# Patient Record
Sex: Female | Born: 1965
Health system: Southern US, Community
[De-identification: ages and names within clinical notes are randomized; demographics above are authoritative.]

## PROBLEM LIST (undated history)

## (undated) DIAGNOSIS — D219 Benign neoplasm of connective and other soft tissue, unspecified: Secondary | ICD-10-CM

## (undated) HISTORY — PX: MOHS SURGERY: SUR867

## (undated) HISTORY — DX: Benign neoplasm of connective and other soft tissue, unspecified: D21.9

---

## 1998-05-03 ENCOUNTER — Other Ambulatory Visit: Admission: RE | Admit: 1998-05-03 | Discharge: 1998-05-03 | Payer: Self-pay | Admitting: Obstetrics and Gynecology

## 1999-09-26 ENCOUNTER — Other Ambulatory Visit: Admission: RE | Admit: 1999-09-26 | Discharge: 1999-09-26 | Payer: Self-pay | Admitting: Obstetrics and Gynecology

## 2000-06-04 ENCOUNTER — Encounter: Admission: RE | Admit: 2000-06-04 | Discharge: 2000-06-04 | Payer: Self-pay | Admitting: Family Medicine

## 2000-06-04 ENCOUNTER — Encounter: Payer: Self-pay | Admitting: Family Medicine

## 2000-10-11 ENCOUNTER — Other Ambulatory Visit: Admission: RE | Admit: 2000-10-11 | Discharge: 2000-10-11 | Payer: Self-pay | Admitting: Obstetrics and Gynecology

## 2001-07-23 ENCOUNTER — Encounter: Payer: Self-pay | Admitting: Family Medicine

## 2001-07-23 ENCOUNTER — Encounter: Admission: RE | Admit: 2001-07-23 | Discharge: 2001-07-23 | Payer: Self-pay | Admitting: Family Medicine

## 2001-10-15 ENCOUNTER — Other Ambulatory Visit: Admission: RE | Admit: 2001-10-15 | Discharge: 2001-10-15 | Payer: Self-pay | Admitting: Obstetrics and Gynecology

## 2002-10-17 ENCOUNTER — Other Ambulatory Visit: Admission: RE | Admit: 2002-10-17 | Discharge: 2002-10-17 | Payer: Self-pay | Admitting: Obstetrics and Gynecology

## 2004-03-14 ENCOUNTER — Other Ambulatory Visit: Admission: RE | Admit: 2004-03-14 | Discharge: 2004-03-14 | Payer: Self-pay | Admitting: Obstetrics and Gynecology

## 2005-07-17 ENCOUNTER — Other Ambulatory Visit: Admission: RE | Admit: 2005-07-17 | Discharge: 2005-07-17 | Payer: Self-pay | Admitting: Obstetrics & Gynecology

## 2005-10-31 ENCOUNTER — Encounter: Admission: RE | Admit: 2005-10-31 | Discharge: 2005-10-31 | Payer: Self-pay | Admitting: Gastroenterology

## 2005-12-25 ENCOUNTER — Encounter: Admission: RE | Admit: 2005-12-25 | Discharge: 2005-12-25 | Payer: Self-pay | Admitting: Obstetrics & Gynecology

## 2006-12-18 ENCOUNTER — Other Ambulatory Visit: Admission: RE | Admit: 2006-12-18 | Discharge: 2006-12-18 | Payer: Self-pay | Admitting: Obstetrics & Gynecology

## 2006-12-20 ENCOUNTER — Encounter: Admission: RE | Admit: 2006-12-20 | Discharge: 2006-12-20 | Payer: Self-pay | Admitting: Obstetrics & Gynecology

## 2008-03-09 ENCOUNTER — Encounter: Admission: RE | Admit: 2008-03-09 | Discharge: 2008-03-09 | Payer: Self-pay | Admitting: Obstetrics & Gynecology

## 2008-03-09 ENCOUNTER — Other Ambulatory Visit: Admission: RE | Admit: 2008-03-09 | Discharge: 2008-03-09 | Payer: Self-pay | Admitting: Obstetrics and Gynecology

## 2009-07-05 ENCOUNTER — Encounter: Admission: RE | Admit: 2009-07-05 | Discharge: 2009-07-05 | Payer: Self-pay | Admitting: Obstetrics & Gynecology

## 2010-06-20 ENCOUNTER — Encounter: Payer: Self-pay | Admitting: Obstetrics & Gynecology

## 2011-03-01 ENCOUNTER — Other Ambulatory Visit: Payer: Self-pay | Admitting: Obstetrics & Gynecology

## 2011-03-01 DIAGNOSIS — Z1231 Encounter for screening mammogram for malignant neoplasm of breast: Secondary | ICD-10-CM

## 2011-03-20 ENCOUNTER — Ambulatory Visit
Admission: RE | Admit: 2011-03-20 | Discharge: 2011-03-20 | Disposition: A | Discharge status: Home / Self Care | Payer: 59 | Source: Ambulatory Visit | Attending: Obstetrics & Gynecology | Admitting: Obstetrics & Gynecology

## 2011-03-20 DIAGNOSIS — Z1231 Encounter for screening mammogram for malignant neoplasm of breast: Secondary | ICD-10-CM

## 2012-01-09 ENCOUNTER — Other Ambulatory Visit: Payer: Self-pay | Admitting: Obstetrics & Gynecology

## 2012-01-09 DIAGNOSIS — Z1231 Encounter for screening mammogram for malignant neoplasm of breast: Secondary | ICD-10-CM

## 2012-01-09 DIAGNOSIS — N6321 Unspecified lump in the left breast, upper outer quadrant: Secondary | ICD-10-CM

## 2012-04-11 ENCOUNTER — Ambulatory Visit: Payer: 59

## 2012-04-11 ENCOUNTER — Other Ambulatory Visit: Payer: Self-pay | Admitting: Obstetrics & Gynecology

## 2012-04-11 DIAGNOSIS — N6321 Unspecified lump in the left breast, upper outer quadrant: Secondary | ICD-10-CM

## 2012-04-11 LAB — HM PAP SMEAR: HM Pap smear: NEGATIVE

## 2012-04-18 ENCOUNTER — Ambulatory Visit
Admission: RE | Admit: 2012-04-18 | Discharge: 2012-04-18 | Disposition: A | Discharge status: Home / Self Care | Payer: 59 | Source: Ambulatory Visit | Attending: Obstetrics & Gynecology | Admitting: Obstetrics & Gynecology

## 2012-04-18 DIAGNOSIS — N6321 Unspecified lump in the left breast, upper outer quadrant: Secondary | ICD-10-CM

## 2012-04-18 LAB — HM MAMMOGRAPHY

## 2012-09-23 ENCOUNTER — Telehealth: Payer: Self-pay | Admitting: Certified Nurse Midwife

## 2012-09-23 NOTE — Telephone Encounter (Signed)
Pt is bleeding between cycles. Please call to schedule an appt.

## 2012-09-23 NOTE — Telephone Encounter (Signed)
Patient stated is having bleeding for the first time @ her last cycle a week ago. Concern this is new for her. Request appt. With Ortencia Kick, CNM. Appt. Given wed. @ 9:15am.

## 2012-09-24 ENCOUNTER — Encounter: Payer: Self-pay | Admitting: Certified Nurse Midwife

## 2012-09-25 ENCOUNTER — Encounter: Payer: Self-pay | Admitting: Certified Nurse Midwife

## 2012-09-25 ENCOUNTER — Ambulatory Visit (INDEPENDENT_AMBULATORY_CARE_PROVIDER_SITE_OTHER): Payer: 59 | Admitting: Certified Nurse Midwife

## 2012-09-25 VITALS — BP 100/60 | HR 72 | Resp 16

## 2012-09-25 DIAGNOSIS — N6012 Diffuse cystic mastopathy of left breast: Secondary | ICD-10-CM

## 2012-09-25 DIAGNOSIS — N6019 Diffuse cystic mastopathy of unspecified breast: Secondary | ICD-10-CM

## 2012-09-25 DIAGNOSIS — N949 Unspecified condition associated with female genital organs and menstrual cycle: Secondary | ICD-10-CM

## 2012-09-25 DIAGNOSIS — N938 Other specified abnormal uterine and vaginal bleeding: Secondary | ICD-10-CM

## 2012-09-25 DIAGNOSIS — R350 Frequency of micturition: Secondary | ICD-10-CM

## 2012-09-25 LAB — POCT URINALYSIS DIPSTICK
Bilirubin, UA: NEGATIVE
Spec Grav, UA: 1.02
Urobilinogen, UA: NEGATIVE

## 2012-09-25 LAB — TSH: TSH: 1.696 u[IU]/mL (ref 0.350–4.500)

## 2012-09-25 NOTE — Progress Notes (Signed)
47 yo mw female g1p1 here complaining of two periods in one month. LMP occurred  4-14 to 4-18, normal, no issues, then had bleeding again 4-25 with 3 day duration, light pad only one daily, light cramping no medication needed. Contraception spouse vasectomy. Patient has never had any changes in her cycle "ever".  Periods usually monthly 3-4 days, occasional cramping, moderate to light flow. Patient denies any additional medications, only new change is regular exercise with weights and dietary change for healthier eating with 5 lb weight loss.  Patient states" never had period change" just wanted to make sure no issues. Also due for follow up left breast check after mammogram noted small cyst cluster in left breast on routine screening, with benign findings. Patient does monthly SBE no change noted, she can feel cyst area now after being shown at mammogram.  Denies nipple discharge, skin change or pain. Patient also seen at [facility] for ? Blood in urine, but she later realized it was coming from her vagina, but had noted some urinary frequency.  Urine there showed leukocytes moderate, please check.  Denies urinary urgency, pain or blood in urine. No fever or chills, or back pain.  O: Healthy WD WN female Affect: normal, orientation X 3 Urine: negative Contraception: spouse vasectomy  Exam: Skin: warm and dry Thyroid: normal, nodules noted CVAT: negative Abdomen: soft non tender, negative suprapubic tenderness Breasts: Left breast @ 1'oclock 3-4 fb from nipple movile cystic mass pea size, non tender, no nipple discharge or skin change( this agrees with mammogram finding) and patient palpation, Right breast no masses, skin change or nipple discharge.  Pelvic Exam: External Genital area normal no lesions BUS: normal, non tender Bladder: non tender  Vagina: no blood present, normal appearance Cervix: normal, non tender, no present Uterus: nodular anterior,non tender, anteflexed( known  fibroid) Adnexa: normal no masses, non tender Perineal area: no lesions  A: DUB X1 only with normal pelvic exam Left Breast cyst, no change from mammogram results Urinary frequency x 1 with negative urine  P: Discussed normal pelvic exam and bleeding can occur at ovulation at times, also thyroid can affect cycles, along with weight gain or loss, also perimenopausal changes and with history of fibroid.. Questions addressed.  Given menses calendar with normal and abnormal parameters to keep, notify if continues to occur over the next two months.  Patient agreeable. Discussed finding of no change and keep mammogram follow up. Patient agreeable.  Continue SBE, notify if change Reassured all negative, notify if change, increase water po daily. Lab: TSH  Rv prn as above Reviewed, TL

## 2013-04-08 ENCOUNTER — Other Ambulatory Visit: Payer: Self-pay

## 2013-04-08 DIAGNOSIS — Z1231 Encounter for screening mammogram for malignant neoplasm of breast: Secondary | ICD-10-CM

## 2013-05-02 ENCOUNTER — Encounter: Payer: Self-pay | Admitting: Certified Nurse Midwife

## 2013-05-02 ENCOUNTER — Ambulatory Visit (INDEPENDENT_AMBULATORY_CARE_PROVIDER_SITE_OTHER): Payer: 59 | Admitting: Certified Nurse Midwife

## 2013-05-02 VITALS — BP 102/60 | HR 60 | Resp 16 | Ht <= 58 in | Wt 110.0 lb

## 2013-05-02 DIAGNOSIS — Z01419 Encounter for gynecological examination (general) (routine) without abnormal findings: Secondary | ICD-10-CM

## 2013-05-02 DIAGNOSIS — Z0000 Encounter for general adult medical examination without abnormal findings: Secondary | ICD-10-CM

## 2013-05-02 LAB — HEMOGLOBIN A1C: Mean Plasma Glucose: 80 mg/dL (ref <117)

## 2013-05-02 LAB — COMPREHENSIVE METABOLIC PANEL
ALT: 12 U/L (ref 0–35)
AST: 15 U/L (ref 0–37)
Albumin: 4.7 g/dL (ref 3.5–5.2)
BUN: 13 mg/dL (ref 6–23)
CO2: 29 mEq/L (ref 19–32)
Creat: 0.6 mg/dL (ref 0.50–1.10)
Glucose, Bld: 76 mg/dL (ref 70–99)
Potassium: 4 mEq/L (ref 3.5–5.3)
Sodium: 138 mEq/L (ref 135–145)
Total Protein: 7.3 g/dL (ref 6.0–8.3)

## 2013-05-02 NOTE — Patient Instructions (Signed)

## 2013-05-02 NOTE — Progress Notes (Signed)
47 y.o. G63P1001 Married Caucasian Fe here for annual exam.  Periods normal but longer duration 5-7 days. No issues with now. Feeling well. Sees PCP prn. No health issues today.  Patient's last menstrual period was 04/28/2013.          Sexually active: yes  The current method of family planning is vasectomy.    Exercising: yes  walk & light weights Smoker:  no  Health Maintenance: Pap:  04-11-12 neg HPV HR neg MMG:  11/13 scheduled for monday Colonoscopy:  none BMD:   none TDaP:  2013 Labs: Hgb-14.6 Self breast exam: done occ   reports that she has never smoked. She does not have any smokeless tobacco history on file. She reports that she does not drink alcohol or use illicit drugs.  Past Medical History  Diagnosis Date  . Fibroid     History reviewed. No pertinent past surgical history.  Current Outpatient Prescriptions  Medication Sig Dispense Refill  . Calcium Carbonate-Vitamin D (CALCIUM + D PO) Take by mouth as needed.       . Cholecalciferol (VITAMIN D PO) Take 2,000 Int'l Units by mouth daily.       . clobetasol cream (TEMOVATE) 0.05 % as needed.       . doxycycline (VIBRAMYCIN) 50 MG capsule 2 (two) times daily.       No current facility-administered medications for this visit.    Family History  Problem Relation Age of Onset  . Osteoporosis Mother   . Cancer Father     melanoma  . Hypertension Father   . Heart attack Father   . Cancer Sister     melanoma  . Cancer Sister     melanoma    ROS:  Pertinent items are noted in HPI.  Otherwise, a comprehensive ROS was negative.  Exam:   BP 102/60  Pulse 60  Resp 16  Ht 4\' 8"  (1.422 m)  Wt 110 lb (49.896 kg)  BMI 24.68 kg/m2  LMP 04/28/2013 Height: 4\' 8"  (142.2 cm)  Ht Readings from Last 3 Encounters:  05/02/13 4\' 8"  (1.422 m)    General appearance: alert, cooperative and appears stated age Head: Normocephalic, without obvious abnormality, atraumatic Neck: no adenopathy, supple, symmetrical, trachea  midline and thyroid normal to inspection and palpation and non-palpable Lungs: clear to auscultation bilaterally Breasts: normal appearance, no masses or tenderness, No nipple retraction or dimpling, No nipple discharge or bleeding, No axillary or supraclavicular adenopathy Heart: regular rate and rhythm Abdomen: soft, non-tender; no masses,  no organomegaly Extremities: extremities normal, atraumatic, no cyanosis or edema Skin: Skin color, texture, turgor normal. No rashes or lesions Lymph nodes: Cervical, supraclavicular, and axillary nodes normal. No abnormal inguinal nodes palpated Neurologic: Grossly normal   Pelvic: External genitalia:  no lesions              Urethra:  normal appearing urethra with no masses, tenderness or lesions              Bartholin's and Skene's: normal                 Vagina: normal appearing vagina with normal color and discharge, no lesions              Cervix: normal, non  tender              Pap taken: yes Bimanual Exam:  Uterus:  normal size, contour, position, consistency, mobility, non-tender and anteverted  Adnexa: normal adnexa and no mass, fullness, tenderness               Rectovaginal: Confirms               Anus:  normal sphincter tone, no lesions  A:  Well Woman with normal exam  Labs requested    P:   Reviewed health and wellness pertinent to exam  Labs:CMP,Hgb A1-c  Pap smear as per guidelines   Mammogram yearly pap smear taken today with reflex  counseled on breast self exam, mammography screening, adequate intake of calcium and vitamin D, diet and exercise  return annually or prn  An After Visit Summary was printed and given to the patient.

## 2013-05-03 LAB — VITAMIN D 25 HYDROXY (VIT D DEFICIENCY, FRACTURES): Vit D, 25-Hydroxy: 32 ng/mL (ref 30–89)

## 2013-05-05 ENCOUNTER — Ambulatory Visit: Payer: 59

## 2013-05-06 NOTE — Progress Notes (Signed)
Note reviewed, agree with plan.  Shandale Malak, MD  

## 2013-05-07 ENCOUNTER — Ambulatory Visit: Payer: 59

## 2013-05-26 ENCOUNTER — Ambulatory Visit
Admission: RE | Admit: 2013-05-26 | Discharge: 2013-05-26 | Disposition: A | Discharge status: Home / Self Care | Payer: 59 | Source: Ambulatory Visit

## 2013-05-26 DIAGNOSIS — Z1231 Encounter for screening mammogram for malignant neoplasm of breast: Secondary | ICD-10-CM

## 2013-06-11 ENCOUNTER — Telehealth: Payer: Self-pay | Admitting: Certified Nurse Midwife

## 2013-06-11 NOTE — Telephone Encounter (Signed)
Patient was calling to give debbi results from her mammogram "normal but extremely dense breast tissue which puts her at higher risk for breast cancer. Is there anything she needs to be doing to keep it in check besides a mammogram yearly? They told her something about people getting ultrasounds who have dense breast tissue."

## 2013-06-11 NOTE — Telephone Encounter (Signed)
Return call to patient, LMTCB.  Call to cell, Patient has just received letter regarding very dense breasts. She did research on her own and is aware this is a new law that patients are notified.  She did have a 3D mmg since was recalled the previous year for an ultrasound.  She read that up to 50% of cancers are missed in people with very dense breast and she wants to know if she should have any additional imaging? Please advise.

## 2013-06-12 NOTE — Telephone Encounter (Signed)
Patient notified of [name]'s response and that Dr Sabra Heck agrees with these recommendations as well. Advised to contact office for appt if she notices any change on monthly SBE. Patient agreeable. Encounter closed.

## 2013-06-12 NOTE — Telephone Encounter (Signed)
The current recommendation is for dense breast to have 3D imaging, routine monthly SBE and clinical exam yearly, unless family history positive for breast cancer( mother,sister,etc). I recently discussed this with Dr. Sabra Heck and these were her recommendations also.

## 2013-06-23 ENCOUNTER — Encounter: Payer: Self-pay | Admitting: Certified Nurse Midwife

## 2013-06-23 ENCOUNTER — Ambulatory Visit (INDEPENDENT_AMBULATORY_CARE_PROVIDER_SITE_OTHER): Payer: BC Managed Care – PPO | Admitting: Certified Nurse Midwife

## 2013-06-23 VITALS — BP 120/80 | HR 84 | Temp 98.2°F | Resp 16 | Ht <= 58 in | Wt 112.0 lb

## 2013-06-23 DIAGNOSIS — N390 Urinary tract infection, site not specified: Secondary | ICD-10-CM

## 2013-06-23 DIAGNOSIS — B373 Candidiasis of vulva and vagina: Secondary | ICD-10-CM

## 2013-06-23 DIAGNOSIS — B3731 Acute candidiasis of vulva and vagina: Secondary | ICD-10-CM

## 2013-06-23 LAB — POCT URINALYSIS DIPSTICK
Bilirubin, UA: NEGATIVE
Glucose, UA: NEGATIVE
Ketones, UA: NEGATIVE
LEUKOCYTES UA: NEGATIVE
NITRITE UA: NEGATIVE
Protein, UA: NEGATIVE
Urobilinogen, UA: NEGATIVE

## 2013-06-23 MED ORDER — FLUCONAZOLE 150 MG PO TABS: ORAL_TABLET | ORAL | Status: DC

## 2013-06-23 MED ORDER — PHENAZOPYRIDINE HCL 200 MG PO TABS: 200.0000 mg | ORAL_TABLET | Freq: Three times a day (TID) | ORAL | Status: DC | PRN

## 2013-06-23 NOTE — Progress Notes (Signed)
S:  48 y.o.Married Caucasian female presents with UTI 06/11/13  symptoms of urinary leakage spontaneous, no association with cough, sneeze or bearing down.  The patient is having no constitutional symptoms, denying fever, chills, anorexia, back pain or headache. Current method of birth control, spouse vasectomy. Patient denies urinary frequency, urgency or pain with urination. Patient did notice slight vaginal itching, but no discharge or vaginal dryness. Patient CMA with MD and had urine culture in office,showed 50 to 100,000 mixed flora. Patient was treated with 5 days of Bactrim with no change. Patient does not need pad for leakage, but can feel it when it occurs most of the time . No new personal products or thong use or pain with sexual activity. No other health issues today.  ROS: feels well  O alert, oriented to person, place, and time   healthy, well developed and well nourished  Skin: warm and dry  Abdomen: very slight suprapubic tenderness  CVAT: negative bilateral  Pelvic exam: External genital area: vulva increase pink color no lesions or scaling or exudate  Urethra, Urethral meatus non tender, no leaking with coughing or bearing down  Bladder: slight tenderness  Vagina: white thick non odorous discharge noted,ph 4.0, wet prep taken  Cervix: non tender, normal  Uterus: firm, irregular feel anterior, c/w history of fibroid, non tender  Adnexa: no masses, non tender, normal    Diagnostic Test:    Poct urine rbc- trace  Wet Prep: positive for yeast only  Assessment: Yeast vaginitis Mixed incontinence ? Due to yeast or UTI     Plan:Discssed findings with patient and association with urinary symptoms. Rx Diflucan see order Discussed will send repeat culture to see if change. Instructed to increase water intake and decrease any caffeine intake. Rx Pyridium see order Lab: Urine culture Patient to start DIflucan and call if symptoms decrease.  RV prn

## 2013-06-24 LAB — URINE CULTURE
COLONY COUNT: NO GROWTH
Organism ID, Bacteria: NO GROWTH

## 2013-06-25 ENCOUNTER — Encounter: Payer: Self-pay | Admitting: Certified Nurse Midwife

## 2013-06-27 ENCOUNTER — Encounter: Payer: Self-pay | Admitting: Certified Nurse Midwife

## 2013-06-27 DIAGNOSIS — N858 Other specified noninflammatory disorders of uterus: Secondary | ICD-10-CM

## 2013-06-27 DIAGNOSIS — D219 Benign neoplasm of connective and other soft tissue, unspecified: Secondary | ICD-10-CM

## 2013-06-27 NOTE — Telephone Encounter (Signed)
Message copied by Laqueta Due on Fri Jun 27, 2013  2:47 PM ------      Message from: Megan Salon      Created: Fri Jun 27, 2013  2:03 PM      Regarding: pus       Pt needs PUS to check fibroids.  Order placed.  She sent message to [name] through [place].  I corresponded back and said you two would be calling.  PUS with me please.              MSM ------

## 2013-06-27 NOTE — Addendum Note (Signed)
Addended by: Megan Salon on: 06/27/2013 02:04 PM   Modules accepted: Orders

## 2013-06-27 NOTE — Telephone Encounter (Signed)
Pre-cert complete//Left message for patient to call back to schedule PUS.//ssf

## 2013-06-30 ENCOUNTER — Telehealth: Payer: Self-pay | Admitting: Obstetrics & Gynecology

## 2013-06-30 NOTE — Telephone Encounter (Signed)
Patient returned call/ advised of $408.26 pr for PUS/ scheduled PUS/ advised of cancellation policy and cancellation fee/patient agrees//ssf

## 2013-06-30 NOTE — Telephone Encounter (Signed)
Left message for patient to call back and schedule PUS

## 2013-06-30 NOTE — Progress Notes (Signed)
Reviewed personally.  M. Suzanne Audreyanna Butkiewicz, MD.  

## 2013-07-10 ENCOUNTER — Other Ambulatory Visit: Payer: BC Managed Care – PPO

## 2013-07-10 ENCOUNTER — Other Ambulatory Visit: Payer: BC Managed Care – PPO | Admitting: Obstetrics & Gynecology

## 2013-07-10 ENCOUNTER — Telehealth: Payer: Self-pay | Admitting: Obstetrics & Gynecology

## 2013-07-10 ENCOUNTER — Ambulatory Visit (INDEPENDENT_AMBULATORY_CARE_PROVIDER_SITE_OTHER): Payer: BC Managed Care – PPO | Admitting: Obstetrics & Gynecology

## 2013-07-10 ENCOUNTER — Ambulatory Visit (INDEPENDENT_AMBULATORY_CARE_PROVIDER_SITE_OTHER): Payer: BC Managed Care – PPO

## 2013-07-10 ENCOUNTER — Other Ambulatory Visit: Payer: Self-pay | Admitting: *Deleted

## 2013-07-10 DIAGNOSIS — B3731 Acute candidiasis of vulva and vagina: Secondary | ICD-10-CM

## 2013-07-10 DIAGNOSIS — R32 Unspecified urinary incontinence: Secondary | ICD-10-CM

## 2013-07-10 DIAGNOSIS — N949 Unspecified condition associated with female genital organs and menstrual cycle: Secondary | ICD-10-CM

## 2013-07-10 DIAGNOSIS — D259 Leiomyoma of uterus, unspecified: Secondary | ICD-10-CM

## 2013-07-10 DIAGNOSIS — B373 Candidiasis of vulva and vagina: Secondary | ICD-10-CM

## 2013-07-10 DIAGNOSIS — R1900 Intra-abdominal and pelvic swelling, mass and lump, unspecified site: Secondary | ICD-10-CM

## 2013-07-10 DIAGNOSIS — N938 Other specified abnormal uterine and vaginal bleeding: Secondary | ICD-10-CM

## 2013-07-10 DIAGNOSIS — D219 Benign neoplasm of connective and other soft tissue, unspecified: Secondary | ICD-10-CM

## 2013-07-10 MED ORDER — FLUCONAZOLE 150 MG PO TABS: ORAL_TABLET | ORAL | Status: DC

## 2013-07-10 NOTE — Progress Notes (Signed)
48 y.o.Marriedfemale here for a pelvic ultrasound.  Patient is well know to me.  Reports over a month of issues with urinary urgency and incontinence.  Feels like she needs to urinate without almost any warning.  This was especially distressing with exercise.  She came and saw [facility] who diagnosed her with yeast vaginitis and the symptoms have almost completely resolved.  Patient obviously distressed about the incontinence.  She states it made her feel almost crazy as it seemed to come out of the blue without any warning and she just didn't seem like she was getting any answers or that we responded quickly enough to how distressing this was for her.  She felt like she had to "ask" for the ultrasound today.  She has a hx of fibroids.  D/W pt it would be very unlikely that fibroids would grow fast enough to cause such a quick onset of incontinence and that is why ultrasound isn't high on the list of tests for this problem.  Pt is in long term monogamous relationship.  No STD concerns.  Finally, cycles aren't regular and have been changing.  Even when irregular, flow is not terrible.  She usually has a day or two that is heavier but manageable.  Patient's last menstrual period was 06/02/2013.  FINDINGS: UTERUS: 9.7 x 6.1 x 4.8cm with several small fibroids, 55mm, 59mm, 72mm, 43mm, 68mm (slightly increased in size) EMS: appears thickened but day 39 of cycle ADNEXA:   Left ovary 2.7 x 2.0 x 1.9JK with 93OI follicle   Right ovary 2.3 x 1.6 x 1.5cm CUL DE SAC: no free fluid  Images reviewed with patient.  I feel her symptoms could have been due to urethritis associated with a yeast vaginitis.  This could have given her the urgency and incontinence symptoms.  As it has essentially resolved with a single treatment or oral diflucan, it seems excessive at this time to proceed with urodynamic testing at this time as that is invasive and expensive .  She is in agreement but wants to know if this happens again,  can she just proceed.  I feel then, yes, would be more reasonable to proceed.  She and I discussed pelvic PT and I think she would do really well with this.  She would like referral.  Assessment:  Urinary incontinence that has resolved Recent yeast vaginitis Uterine fibroids, mildly increased in size Thickened endometrium, appropriate for day of cycle DUB.  Pt not really ready to treatment for this right now.  Will let me know if cycles becomes heavier or if no cycle in 90 D time frame.  Plan: Rx for diflucan to pharmacy if patient needs it Referral to Ileana Roup at [facility] urology Pt to call if has new onset of any new incontinence symptoms Patient in agreement with plan.  ~30 minutes spent with patient >50% of time was in face to face discussion of above.

## 2013-07-10 NOTE — Telephone Encounter (Signed)
Patient needs Itemized receipt for the 408.26 she had to pay for PUS.

## 2013-07-16 ENCOUNTER — Encounter: Payer: Self-pay | Admitting: Obstetrics & Gynecology

## 2013-07-22 ENCOUNTER — Telehealth: Payer: Self-pay | Admitting: Obstetrics & Gynecology

## 2013-07-22 NOTE — Telephone Encounter (Signed)
Received fax from [facility] with [facility] confirming patient appointment 03.11.2015. Patient aware.

## 2013-08-07 ENCOUNTER — Encounter: Payer: Self-pay | Admitting: Obstetrics & Gynecology

## 2013-08-12 ENCOUNTER — Encounter: Payer: Self-pay | Admitting: Obstetrics & Gynecology

## 2013-08-14 ENCOUNTER — Other Ambulatory Visit: Payer: Self-pay | Admitting: Obstetrics & Gynecology

## 2013-08-14 DIAGNOSIS — R32 Unspecified urinary incontinence: Secondary | ICD-10-CM

## 2013-08-15 ENCOUNTER — Telehealth: Payer: Self-pay | Admitting: Obstetrics & Gynecology

## 2013-08-15 NOTE — Telephone Encounter (Signed)
Patient returned call. Advised patient that she is scheduled with Dr Matilde Sprang 03.23.2014 @ 0800. Provided telephone # [phone] for [facility].

## 2013-08-15 NOTE — Telephone Encounter (Signed)
Left message for patient to call back  

## 2014-03-30 ENCOUNTER — Encounter: Payer: Self-pay | Admitting: Obstetrics & Gynecology

## 2014-04-28 ENCOUNTER — Other Ambulatory Visit: Payer: Self-pay

## 2014-04-28 DIAGNOSIS — Z1231 Encounter for screening mammogram for malignant neoplasm of breast: Secondary | ICD-10-CM

## 2014-05-07 ENCOUNTER — Ambulatory Visit: Payer: 59 | Admitting: Certified Nurse Midwife

## 2014-05-08 ENCOUNTER — Ambulatory Visit: Payer: 59 | Admitting: Certified Nurse Midwife

## 2014-05-27 ENCOUNTER — Ambulatory Visit: Payer: BC Managed Care – PPO | Admitting: Certified Nurse Midwife

## 2014-05-28 ENCOUNTER — Ambulatory Visit: Payer: BC Managed Care – PPO | Admitting: Certified Nurse Midwife

## 2014-05-28 ENCOUNTER — Ambulatory Visit
Admission: RE | Admit: 2014-05-28 | Discharge: 2014-05-28 | Disposition: A | Discharge status: Home / Self Care | Payer: BC Managed Care – PPO | Source: Ambulatory Visit

## 2014-05-28 ENCOUNTER — Ambulatory Visit (INDEPENDENT_AMBULATORY_CARE_PROVIDER_SITE_OTHER): Payer: BC Managed Care – PPO | Admitting: Certified Nurse Midwife

## 2014-05-28 ENCOUNTER — Encounter: Payer: Self-pay | Admitting: Certified Nurse Midwife

## 2014-05-28 VITALS — BP 126/80 | HR 72 | Ht <= 58 in | Wt 111.0 lb

## 2014-05-28 DIAGNOSIS — Z01419 Encounter for gynecological examination (general) (routine) without abnormal findings: Secondary | ICD-10-CM

## 2014-05-28 DIAGNOSIS — N390 Urinary tract infection, site not specified: Secondary | ICD-10-CM

## 2014-05-28 DIAGNOSIS — Z1231 Encounter for screening mammogram for malignant neoplasm of breast: Secondary | ICD-10-CM

## 2014-05-28 DIAGNOSIS — Z124 Encounter for screening for malignant neoplasm of cervix: Secondary | ICD-10-CM

## 2014-05-28 DIAGNOSIS — D259 Leiomyoma of uterus, unspecified: Secondary | ICD-10-CM

## 2014-05-28 DIAGNOSIS — Z0000 Encounter for general adult medical examination without abnormal findings: Secondary | ICD-10-CM

## 2014-05-28 LAB — COMPREHENSIVE METABOLIC PANEL
ALBUMIN: 4.5 g/dL (ref 3.5–5.2)
ALT: 11 U/L (ref 0–35)
AST: 14 U/L (ref 0–37)
Alkaline Phosphatase: 48 U/L (ref 39–117)
BILIRUBIN TOTAL: 1.1 mg/dL (ref 0.2–1.2)
BUN: 14 mg/dL (ref 6–23)
CALCIUM: 9.3 mg/dL (ref 8.4–10.5)
CO2: 29 mEq/L (ref 19–32)
CREATININE: 0.63 mg/dL (ref 0.50–1.10)
Chloride: 105 mEq/L (ref 96–112)
Glucose, Bld: 84 mg/dL (ref 70–99)
Potassium: 4.1 mEq/L (ref 3.5–5.3)
SODIUM: 141 meq/L (ref 135–145)
Total Protein: 6.8 g/dL (ref 6.0–8.3)

## 2014-05-28 LAB — LIPID PANEL
Cholesterol: 140 mg/dL (ref 0–200)
HDL: 45 mg/dL (ref >39)
LDL CALC: 67 mg/dL (ref 0–99)
TRIGLYCERIDES: 138 mg/dL (ref <150)
Total CHOL/HDL Ratio: 3.1 Ratio
VLDL: 28 mg/dL (ref 0–40)

## 2014-05-28 LAB — CBC
HEMATOCRIT: 40.8 % (ref 36.0–46.0)
HEMOGLOBIN: 14.6 g/dL (ref 12.0–15.0)
MCH: 33.2 pg (ref 26.0–34.0)
MCHC: 35.8 g/dL (ref 30.0–36.0)
MCV: 92.7 fL (ref 78.0–100.0)
MPV: 8.7 fL (ref 8.6–12.4)
Platelets: 186 10*3/uL (ref 150–400)
RBC: 4.4 MIL/uL (ref 3.87–5.11)
RDW: 12.7 % (ref 11.5–15.5)
WBC: 4.4 10*3/uL (ref 4.0–10.5)

## 2014-05-28 LAB — POCT URINALYSIS DIPSTICK
BILIRUBIN UA: NEGATIVE
Blood, UA: NEGATIVE
Glucose, UA: NEGATIVE
KETONES UA: NEGATIVE
Nitrite, UA: NEGATIVE
Protein, UA: NEGATIVE
Urobilinogen, UA: NEGATIVE
pH, UA: 5

## 2014-05-28 LAB — VITAMIN D 25 HYDROXY (VIT D DEFICIENCY, FRACTURES): VIT D 25 HYDROXY: 30 ng/mL (ref 30–100)

## 2014-05-28 NOTE — Progress Notes (Signed)
48 y.o. G98P1001 Married Caucasian Fe here for annual exam. Periods regular with longer duration with last cycle only. Patient noted small bump in vaginal area last week which was tender, which is resolving. Please check. Patient requests pap smear today,understands new frequency changes. Sees urgent care if needed. Fasted for screening labs. Does not feel fibroids have changed. Saw Ileana Roup for incontinence and really helped. No other health issues today.  Patient's last menstrual period was 05/02/2014.          Sexually active: Yes.    The current method of family planning is vasectomy.    Exercising: No.  The patient does not participate in regular exercise at present. Smoker:  no  Health Maintenance: Pap:  05/02/13 HR HPV Neg MMG:  05/27/13 Bi-Rads Neg, next scheduled 05/28/14 Colonoscopy:  none BMD:   none TDaP:  2013 Labs: HgB:   14.3                       UA: WBC-Large, ph: 5.0   reports that she has never smoked. She does not have any smokeless tobacco history on file. She reports that she does not drink alcohol or use illicit drugs.  Past Medical History  Diagnosis Date  . Fibroid     History reviewed. No pertinent past surgical history.  Current Outpatient Prescriptions  Medication Sig Dispense Refill  . Calcium Carbonate-Vitamin D (CALCIUM + D PO) Take by mouth as needed.     . Cholecalciferol (VITAMIN D PO) Take 2,000 Int'l Units by mouth daily.     . clobetasol cream (TEMOVATE) 0.05 % as needed.      No current facility-administered medications for this visit.    Family History  Problem Relation Age of Onset  . Osteoporosis Mother   . Cancer Father     melanoma  . Hypertension Father   . Heart attack Father   . Cancer Sister     melanoma  . Cancer Sister     melanoma    ROS:  Pertinent items are noted in HPI.  Otherwise, a comprehensive ROS was negative.  Exam:   BP 126/80 mmHg  Pulse 72  Ht 4\' 8"  (1.422 m)  Wt 111 lb (50.349 kg)  BMI 24.90 kg/m2   LMP 05/02/2014 Height: 4\' 8"  (142.2 cm)  Ht Readings from Last 3 Encounters:  05/28/14 4\' 8"  (1.422 m)  06/23/13 4\' 8"  (1.422 m)  05/02/13 4\' 8"  (1.422 m)    General appearance: alert, cooperative and appears stated age Head: Normocephalic, without obvious abnormality, atraumatic Neck: no adenopathy, supple, symmetrical, trachea midline and thyroid normal to inspection and palpation Lungs: clear to auscultation bilaterally Breasts: normal appearance, no masses or tenderness, No nipple retraction or dimpling, No nipple discharge or bleeding, No axillary or supraclavicular adenopathy Heart: regular rate and rhythm Abdomen: soft, non-tender; no masses,  no organomegaly, negative suprapubic Extremities: extremities normal, atraumatic, no cyanosis or edema Skin: Skin color, texture, turgor normal. No rashes or lesions, warm and dry Lymph nodes: Cervical, supraclavicular, and axillary nodes normal. No abnormal inguinal nodes palpated Neurologic: Grossly normal   Pelvic: External genitalia:  no lesions              Urethra:  normal appearing urethra with no masses, tenderness or lesions, Bladder non tender              Bartholin's and Skene's: normal  Vagina: normal appearing vagina with normal color and discharge, no lesions              Cervix: normal non tender, no masses              Pap taken: Yes.   Bimanual Exam:  Uterus:  enlarged, 10 weeks size, no change in size from previous exam              Adnexa: normal adnexa and no mass, fullness, tenderness               Rectovaginal: Confirms               Anus:  normal sphincter tone, no lesions POCT urine large WBC not symptomatic  A:  Well Woman with normal exam  Enlarged uterus, history of fibroids no size change  Stress incontinence with physical therapy use for resolution  Screening labs  R/O UTI  P:   Reviewed health and wellness pertinent to exam  Discussed no change in size and no change symptoms are great  findings  Lab: CMP,TSH,CBC,Lipid panel  Aware of symptoms with and will advise  Labs: Urine micro/culture  Pap smear taken today with HPV reflex   counseled on breast self exam, mammography screening, adequate intake of calcium and vitamin D, diet and exercise  return annually or prn  An After Visit Summary was printed and given to the patient.

## 2014-05-28 NOTE — Patient Instructions (Signed)

## 2014-05-29 LAB — TSH: TSH: 2.035 u[IU]/mL (ref 0.350–4.500)

## 2014-05-29 LAB — URINALYSIS, MICROSCOPIC ONLY
CRYSTALS: NONE SEEN
Casts: NONE SEEN

## 2014-05-31 LAB — URINE CULTURE

## 2014-06-01 ENCOUNTER — Telehealth: Payer: Self-pay

## 2014-06-01 LAB — HEMOGLOBIN, FINGERSTICK: HEMOGLOBIN, FINGERSTICK: 14.3 g/dL (ref 12.0–16.0)

## 2014-06-01 MED ORDER — CIPROFLOXACIN HCL 500 MG PO TABS: 500.0000 mg | ORAL_TABLET | Freq: Two times a day (BID) | ORAL | Status: DC

## 2014-06-01 NOTE — Progress Notes (Signed)
Reviewed personally.  M. Suzanne Timber Lucarelli, MD.  

## 2014-06-01 NOTE — Telephone Encounter (Signed)
-----   Message from Regina Eck, CNM sent at 06/01/2014  6:17 AM EST ----- Notify patient that urine micro was positive for bacteria and urine culture was positive for Klebsiella needs RX Cipro order in needs 2 week TOC for culture and micro

## 2014-06-01 NOTE — Telephone Encounter (Signed)
lmtcb

## 2014-06-02 ENCOUNTER — Other Ambulatory Visit: Payer: Self-pay

## 2014-06-02 LAB — IPS PAP TEST WITH REFLEX TO HPV

## 2014-06-02 MED ORDER — FLUCONAZOLE 150 MG PO TABS: ORAL_TABLET | ORAL | Status: DC

## 2014-06-02 NOTE — Telephone Encounter (Signed)
Pt requesting diflucan to be sent in since she is having to take an antibiotic for uti.

## 2014-06-02 NOTE — Telephone Encounter (Signed)
Left message for call back.

## 2014-06-02 NOTE — Progress Notes (Signed)
Reviewed personally.  M. Suzanne Josemanuel Eakins, MD.  

## 2014-06-02 NOTE — Telephone Encounter (Signed)
Patient notified of results. See lab 

## 2014-07-02 ENCOUNTER — Telehealth: Payer: Self-pay | Admitting: Certified Nurse Midwife

## 2014-07-02 NOTE — Telephone Encounter (Signed)
Awaiting results for Regina Eck CNM review.

## 2014-07-02 NOTE — Telephone Encounter (Signed)
Pt faxed over results from her urine recheck she had done at her job. Would like Debbie to review and call her.

## 2014-07-03 NOTE — Telephone Encounter (Signed)
Left message to call [place] at [phone].  Need to advise patient we have received results and Regina Eck CNM has reviewed. Is patient having any symptoms? Culture shows B-Strep which is a normal flora. Per Regina Eck CNM if asymptomatic no need for treatment at this time. Preferred treatment is with penicillin drug class which patient has an allergy. Will need to discuss with Regina Eck CNM if patient is having symptoms.

## 2014-07-03 NOTE — Telephone Encounter (Signed)
Results on Regina Eck CNM desk for review and advise.

## 2014-07-03 NOTE — Telephone Encounter (Signed)
Spoke with patient. Advised patient of results as seen below which are from written results from [facility]. Patient is agreeable. Patient states that she is not having any current symptoms. Patient will return call if she develops any symptoms or has any concerns. Results to scan.  Regina Eck CNM agree with plan?

## 2014-07-03 NOTE — Telephone Encounter (Signed)
yes

## 2014-07-03 NOTE — Telephone Encounter (Signed)
Patient calling to check on status of call.

## 2015-05-03 DIAGNOSIS — G5601 Carpal tunnel syndrome, right upper limb: Secondary | ICD-10-CM | POA: Insufficient documentation

## 2015-05-03 DIAGNOSIS — Q749 Unspecified congenital malformation of limb(s): Secondary | ICD-10-CM | POA: Insufficient documentation

## 2015-05-03 DIAGNOSIS — Q799 Congenital malformation of musculoskeletal system, unspecified: Secondary | ICD-10-CM

## 2015-05-03 DIAGNOSIS — G5602 Carpal tunnel syndrome, left upper limb: Secondary | ICD-10-CM | POA: Insufficient documentation

## 2015-06-24 ENCOUNTER — Ambulatory Visit: Payer: Self-pay | Admitting: Certified Nurse Midwife

## 2015-10-26 ENCOUNTER — Other Ambulatory Visit: Payer: Self-pay

## 2015-10-26 DIAGNOSIS — Z1231 Encounter for screening mammogram for malignant neoplasm of breast: Secondary | ICD-10-CM

## 2015-11-15 ENCOUNTER — Ambulatory Visit
Admission: RE | Admit: 2015-11-15 | Discharge: 2015-11-15 | Disposition: A | Discharge status: Home / Self Care | Payer: 59 | Source: Ambulatory Visit

## 2015-11-15 DIAGNOSIS — Z1231 Encounter for screening mammogram for malignant neoplasm of breast: Secondary | ICD-10-CM

## 2016-01-20 ENCOUNTER — Encounter: Payer: Self-pay | Admitting: Obstetrics and Gynecology

## 2016-02-25 ENCOUNTER — Ambulatory Visit (INDEPENDENT_AMBULATORY_CARE_PROVIDER_SITE_OTHER): Payer: 59 | Admitting: Certified Nurse Midwife

## 2016-02-25 ENCOUNTER — Encounter: Payer: Self-pay | Admitting: Certified Nurse Midwife

## 2016-02-25 VITALS — BP 122/70 | HR 68 | Resp 16 | Ht <= 58 in | Wt 116.0 lb

## 2016-02-25 DIAGNOSIS — Z01419 Encounter for gynecological examination (general) (routine) without abnormal findings: Secondary | ICD-10-CM | POA: Diagnosis not present

## 2016-02-25 DIAGNOSIS — Z124 Encounter for screening for malignant neoplasm of cervix: Secondary | ICD-10-CM

## 2016-02-25 DIAGNOSIS — N951 Menopausal and female climacteric states: Secondary | ICD-10-CM | POA: Diagnosis not present

## 2016-02-25 DIAGNOSIS — N852 Hypertrophy of uterus: Secondary | ICD-10-CM

## 2016-02-25 DIAGNOSIS — D259 Leiomyoma of uterus, unspecified: Secondary | ICD-10-CM

## 2016-02-25 DIAGNOSIS — Z0000 Encounter for general adult medical examination without abnormal findings: Secondary | ICD-10-CM | POA: Diagnosis not present

## 2016-02-25 LAB — CBC
HCT: 42.6 % (ref 35.0–45.0)
Hemoglobin: 14.6 g/dL (ref 11.7–15.5)
MCH: 33.2 pg — ABNORMAL HIGH (ref 27.0–33.0)
MCHC: 34.3 g/dL (ref 32.0–36.0)
MCV: 96.8 fL (ref 80.0–100.0)
MPV: 9.2 fL (ref 7.5–12.5)
Platelets: 217 10*3/uL (ref 140–400)
RBC: 4.4 MIL/uL (ref 3.80–5.10)
RDW: 12.8 % (ref 11.0–15.0)
WBC: 6.6 10*3/uL (ref 3.8–10.8)

## 2016-02-25 LAB — COMPREHENSIVE METABOLIC PANEL
ALT: 14 U/L (ref 6–29)
AST: 15 U/L (ref 10–35)
Albumin: 4.7 g/dL (ref 3.6–5.1)
Alkaline Phosphatase: 50 U/L (ref 33–115)
BUN: 11 mg/dL (ref 7–25)
CO2: 29 mmol/L (ref 20–31)
Calcium: 9.5 mg/dL (ref 8.6–10.2)
Chloride: 102 mmol/L (ref 98–110)
Creat: 0.62 mg/dL (ref 0.50–1.10)
Glucose, Bld: 88 mg/dL (ref 65–99)
Potassium: 4 mmol/L (ref 3.5–5.3)
Sodium: 139 mmol/L (ref 135–146)
Total Bilirubin: 0.7 mg/dL (ref 0.2–1.2)
Total Protein: 7 g/dL (ref 6.1–8.1)

## 2016-02-25 LAB — POCT URINALYSIS DIPSTICK
Bilirubin, UA: NEGATIVE
Blood, UA: NEGATIVE
GLUCOSE UA: NEGATIVE
Ketones, UA: NEGATIVE
Leukocytes, UA: NEGATIVE
Nitrite, UA: NEGATIVE
PH UA: 5
Protein, UA: NEGATIVE
Urobilinogen, UA: NEGATIVE

## 2016-02-25 LAB — LIPID PANEL
CHOL/HDL RATIO: 3.5 ratio (ref <=5.0)
Cholesterol: 155 mg/dL (ref 125–200)
HDL: 44 mg/dL — ABNORMAL LOW (ref >=46)
LDL Cholesterol: 73 mg/dL (ref <130)
Triglycerides: 190 mg/dL — ABNORMAL HIGH (ref <150)
VLDL: 38 mg/dL — AB (ref <30)

## 2016-02-25 LAB — TSH: TSH: 1.49 m[IU]/L

## 2016-02-25 LAB — FOLLICLE STIMULATING HORMONE: FSH: 47.6 m[IU]/mL

## 2016-02-25 NOTE — Progress Notes (Signed)
Encounter reviewed Jill Jertson, MD   

## 2016-02-25 NOTE — Patient Instructions (Signed)
EXERCISE AND DIET:  We recommended that you start or continue a regular exercise program for good health. Regular exercise means any activity that makes your heart beat faster and makes you sweat.  We recommend exercising at least 30 minutes per day at least 3 days a week, preferably 4 or 5.  We also recommend a diet low in fat and sugar.  Inactivity, poor dietary choices and obesity can cause diabetes, heart attack, stroke, and kidney damage, among others.    ALCOHOL AND SMOKING:  Women should limit their alcohol intake to no more than 7 drinks/beers/glasses of wine (combined, not each!) per week. Moderation of alcohol intake to this level decreases your risk of breast cancer and liver damage. And of course, no recreational drugs are part of a healthy lifestyle.  And absolutely no smoking or even second hand smoke. Most people know smoking can cause heart and lung diseases, but did you know it also contributes to weakening of your bones? Aging of your skin?  Yellowing of your teeth and nails?  CALCIUM AND VITAMIN D:  Adequate intake of calcium and Vitamin D are recommended.  The recommendations for exact amounts of these supplements seem to change often, but generally speaking 600 mg of calcium (either carbonate or citrate) and 800 units of Vitamin D per day seems prudent. Certain women may benefit from higher intake of Vitamin D.  If you are among these women, your doctor will have told you during your visit.    PAP SMEARS:  Pap smears, to check for cervical cancer or precancers,  have traditionally been done yearly, although recent scientific advances have shown that most women can have pap smears less often.  However, every woman still should have a physical exam from her gynecologist every year. It will include a breast check, inspection of the vulva and vagina to check for abnormal growths or skin changes, a visual exam of the cervix, and then an exam to evaluate the size and shape of the uterus and  ovaries.  And after 50 years of age, a rectal exam is indicated to check for rectal cancers. We will also provide age appropriate advice regarding health maintenance, like when you should have certain vaccines, screening for sexually transmitted diseases, bone density testing, colonoscopy, mammograms, etc.   MAMMOGRAMS:  All women over 40 years old should have a yearly mammogram. Many facilities now offer a "3D" mammogram, which may cost around $50 extra out of pocket. If possible,  we recommend you accept the option to have the 3D mammogram performed.  It both reduces the number of women who will be called back for extra views which then turn out to be normal, and it is better than the routine mammogram at detecting truly abnormal areas.    COLONOSCOPY:  Colonoscopy to screen for colon cancer is recommended for all women at age 50.  We know, you hate the idea of the prep.  We agree, BUT, having colon cancer and not knowing it is worse!!  Colon cancer so often starts as a polyp that can be seen and removed at colonscopy, which can quite literally save your life!  And if your first colonoscopy is normal and you have no family history of colon cancer, most women don't have to have it again for 10 years.  Once every ten years, you can do something that may end up saving your life, right?  We will be happy to help you get it scheduled when you are ready.    Be sure to check your insurance coverage so you understand how much it will cost.  It may be covered as a preventative service at no cost, but you should check your particular policy.     Perimenopause Perimenopause is the time when your body begins to move into the menopause (no menstrual period for 12 straight months). It is a natural process. Perimenopause can begin 2-8 years before the menopause and usually lasts for 1 year after the menopause. During this time, your ovaries may or may not produce an egg. The ovaries vary in their production of estrogen and  progesterone hormones each month. This can cause irregular menstrual periods, difficulty getting pregnant, vaginal bleeding between periods, and uncomfortable symptoms. CAUSES  Irregular production of the ovarian hormones, estrogen and progesterone, and not ovulating every month.  Other causes include:  Tumor of the pituitary gland in the brain.  Medical disease that affects the ovaries.  Radiation treatment.  Chemotherapy.  Unknown causes.  Heavy smoking and excessive alcohol intake can bring on perimenopause sooner. SIGNS AND SYMPTOMS   Hot flashes.  Night sweats.  Irregular menstrual periods.  Decreased sex drive.  Vaginal dryness.  Headaches.  Mood swings.  Depression.  Memory problems.  Irritability.  Tiredness.  Weight gain.  Trouble getting pregnant.  The beginning of losing bone cells (osteoporosis).  The beginning of hardening of the arteries (atherosclerosis). DIAGNOSIS  Your health care provider will make a diagnosis by analyzing your age, menstrual history, and symptoms. He or she will do a physical exam and note any changes in your body, especially your female organs. Female hormone tests may or may not be helpful depending on the amount of female hormones you produce and when you produce them. However, other hormone tests may be helpful to rule out other problems. TREATMENT  In some cases, no treatment is needed. The decision on whether treatment is necessary during the perimenopause should be made by you and your health care provider based on how the symptoms are affecting you and your lifestyle. Various treatments are available, such as:  Treating individual symptoms with a specific medicine for that symptom.  Herbal medicines that can help specific symptoms.  Counseling.  Group therapy. HOME CARE INSTRUCTIONS   Keep track of your menstrual periods (when they occur, how heavy they are, how long between periods, and how long they last) as  well as your symptoms and when they started.  Only take over-the-counter or prescription medicines as directed by your health care provider.  Sleep and rest.  Exercise.  Eat a diet that contains calcium (good for your bones) and soy (acts like the estrogen hormone).  Do not smoke.  Avoid alcoholic beverages.  Take vitamin supplements as recommended by your health care provider. Taking vitamin E may help in certain cases.  Take calcium and vitamin D supplements to help prevent bone loss.  Group therapy is sometimes helpful.  Acupuncture may help in some cases. SEEK MEDICAL CARE IF:   You have questions about any symptoms you are having.  You need a referral to a specialist (gynecologist, psychiatrist, or psychologist). SEEK IMMEDIATE MEDICAL CARE IF:   You have vaginal bleeding.  Your period lasts longer than 8 days.  Your periods are recurring sooner than 21 days.  You have bleeding after intercourse.  You have severe depression.  You have pain when you urinate.  You have severe headaches.  You have vision problems.   This information is not intended to replace advice   given to you by your health care provider. Make sure you discuss any questions you have with your health care provider.   Document Released: 06/22/2004 Document Revised: 06/05/2014 Document Reviewed: 12/12/2012 Elsevier Interactive Patient Education 2016 [employer] Inc[employer]  

## 2016-02-25 NOTE — Progress Notes (Signed)
50 y.o. G58P1001 Married  Caucasian Fe here for annual exam. Periods changing with no more than 2 months without period. Last period lighter this last time. Denies any hot flashes, ? Night sweats. No mood changes. Sees [facility] prn. Desires screening labs this year. No health issues today. Both daughters married this year!  Patient's last menstrual period was 02/16/2016 (exact date).          Sexually active: Yes.    The current method of family planning is vasectomy.    Exercising: No.  exercise Smoker:  no  Health Maintenance: Pap:  05-28-14 neg MMG:  11-16-15 category c density birads 1:neg Colonoscopy:  none BMD:   none TDaP:  2013 Shingles: no Pneumonia: no Hep C and HIV: HIV yrs ago done-neg Labs: poct urine-neg Self breast exam: done occ   reports that she has never smoked. She has never used smokeless tobacco. She reports that she does not drink alcohol or use drugs.  Past Medical History:  Diagnosis Date  . Fibroid     History reviewed. No pertinent surgical history.  Current Outpatient Prescriptions  Medication Sig Dispense Refill  . Calcium Carbonate-Vitamin D (CALCIUM + D PO) Take by mouth as needed.     . Cholecalciferol (VITAMIN D PO) Take 2,000 Int'l Units by mouth daily.     . clobetasol cream (TEMOVATE) 0.05 % as needed.      No current facility-administered medications for this visit.     Family History  Problem Relation Age of Onset  . Osteoporosis Mother   . Cancer Father     melanoma  . Hypertension Father   . Heart attack Father   . Cancer Sister     melanoma  . Cancer Sister     melanoma    ROS:  Pertinent items are noted in HPI.  Otherwise, a comprehensive ROS was negative.  Exam:   BP 122/70   Pulse 68   Resp 16   Ht 4' 8.25" (1.429 m)   Wt 116 lb (52.6 kg)   LMP 02/16/2016 (Exact Date)   BMI 25.78 kg/m  Height: 4' 8.25" (142.9 cm) Ht Readings from Last 3 Encounters:  02/25/16 4' 8.25" (1.429 m)  05/28/14 4\' 8"  (1.422 m)   06/23/13 4\' 8"  (1.422 m)    General appearance: alert, cooperative and appears stated age Head: Normocephalic, without obvious abnormality, atraumatic Neck: no adenopathy, supple, symmetrical, trachea midline and thyroid normal to inspection and palpation Lungs: clear to auscultation bilaterally Breasts: normal appearance, no masses or tenderness, No nipple retraction or dimpling, No nipple discharge or bleeding, No axillary or supraclavicular adenopathy Heart: regular rate and rhythm Abdomen: soft, non-tender; no masses,  no organomegaly Extremities: extremities normal, atraumatic, no cyanosis or edema Skin: Skin color, texture, turgor normal. No rashes or lesions Lymph nodes: Cervical, supraclavicular, and axillary nodes normal. No abnormal inguinal nodes palpated Neurologic: Grossly normal   Pelvic: External genitalia:  no lesions              Urethra:  normal appearing urethra with no masses, tenderness or lesions              Bartholin's and Skene's: normal                 Vagina: normal appearing vagina with normal color and discharge, no lesions              Cervix: multiparous appearance, no cervical motion tenderness and no lesions  Pap taken: Yes.   Bimanual Exam:  Uterus:  enlarged, 8-10 weeks size, normal known fibroid, no change in size from previous exam              Adnexa: normal adnexa and no mass, fullness, tenderness               Rectovaginal: Confirms               Anus:  normal sphincter tone, no lesions  Chaperone present: yes  A:  Well Woman with normal exam  Contraception spouse vasectomy  Cycle changes with some amenorrhea ? Perimenopausal  Enlarged uterus history of fibroid no size change  Screening labs  P:   Reviewed health and wellness pertinent to exam  Discussed perimenopause/menopause etiology and cycle change expectations. Questions addressed and handout given. Discussed to notify if no period in 3 months in next year.  Discussed  no uterine size change per notes from previous exam. Warning signs of fibroids given.  Labs: CMP,Lipid panel, Vitamin D, CBC, TSH, Hep C, FSH  Pap smear as above with HPVHR   counseled on breast self exam, mammography screening, menopause, adequate intake of calcium and vitamin D, diet and exercise  return annually or prn  An After Visit Summary was printed and given to the patient.

## 2016-02-26 LAB — VITAMIN D 25 HYDROXY (VIT D DEFICIENCY, FRACTURES): Vit D, 25-Hydroxy: 27 ng/mL — ABNORMAL LOW (ref 30–100)

## 2016-02-26 LAB — HEPATITIS C ANTIBODY: HCV AB: NEGATIVE

## 2016-03-03 LAB — IPS PAP TEST WITH HPV

## 2016-07-24 DIAGNOSIS — Z86018 Personal history of other benign neoplasm: Secondary | ICD-10-CM | POA: Diagnosis not present

## 2016-07-24 DIAGNOSIS — D2261 Melanocytic nevi of right upper limb, including shoulder: Secondary | ICD-10-CM | POA: Diagnosis not present

## 2016-07-24 DIAGNOSIS — D1800 Hemangioma unspecified site: Secondary | ICD-10-CM | POA: Diagnosis not present

## 2016-09-15 DIAGNOSIS — R1032 Left lower quadrant pain: Secondary | ICD-10-CM | POA: Diagnosis not present

## 2016-12-14 ENCOUNTER — Other Ambulatory Visit: Payer: Self-pay | Admitting: Certified Nurse Midwife

## 2016-12-14 ENCOUNTER — Other Ambulatory Visit: Payer: Self-pay | Admitting: Cardiology

## 2016-12-14 DIAGNOSIS — Z1231 Encounter for screening mammogram for malignant neoplasm of breast: Secondary | ICD-10-CM

## 2017-01-22 ENCOUNTER — Ambulatory Visit
Admission: RE | Admit: 2017-01-22 | Discharge: 2017-01-22 | Disposition: A | Discharge status: Home / Self Care | Payer: 59 | Source: Ambulatory Visit | Attending: Certified Nurse Midwife | Admitting: Certified Nurse Midwife

## 2017-01-22 DIAGNOSIS — Z1231 Encounter for screening mammogram for malignant neoplasm of breast: Secondary | ICD-10-CM

## 2017-02-09 DIAGNOSIS — Z1211 Encounter for screening for malignant neoplasm of colon: Secondary | ICD-10-CM | POA: Diagnosis not present

## 2017-02-09 DIAGNOSIS — D126 Benign neoplasm of colon, unspecified: Secondary | ICD-10-CM | POA: Diagnosis not present

## 2017-03-20 DIAGNOSIS — R300 Dysuria: Secondary | ICD-10-CM | POA: Diagnosis not present

## 2017-03-28 ENCOUNTER — Ambulatory Visit (INDEPENDENT_AMBULATORY_CARE_PROVIDER_SITE_OTHER): Payer: 59 | Admitting: Certified Nurse Midwife

## 2017-03-28 ENCOUNTER — Encounter: Payer: Self-pay | Admitting: Certified Nurse Midwife

## 2017-03-28 ENCOUNTER — Other Ambulatory Visit (HOSPITAL_COMMUNITY)
Admission: RE | Admit: 2017-03-28 | Discharge: 2017-03-28 | Disposition: A | Discharge status: Home / Self Care | Payer: 59 | Source: Ambulatory Visit | Attending: Obstetrics & Gynecology | Admitting: Obstetrics & Gynecology

## 2017-03-28 VITALS — BP 114/68 | HR 64 | Resp 16 | Ht <= 58 in | Wt 112.0 lb

## 2017-03-28 DIAGNOSIS — Z0000 Encounter for general adult medical examination without abnormal findings: Secondary | ICD-10-CM

## 2017-03-28 DIAGNOSIS — Z01419 Encounter for gynecological examination (general) (routine) without abnormal findings: Secondary | ICD-10-CM

## 2017-03-28 DIAGNOSIS — Z124 Encounter for screening for malignant neoplasm of cervix: Secondary | ICD-10-CM

## 2017-03-28 DIAGNOSIS — E559 Vitamin D deficiency, unspecified: Secondary | ICD-10-CM | POA: Diagnosis not present

## 2017-03-28 DIAGNOSIS — H269 Unspecified cataract: Secondary | ICD-10-CM | POA: Insufficient documentation

## 2017-03-28 DIAGNOSIS — Q740 Other congenital malformations of upper limb(s), including shoulder girdle: Secondary | ICD-10-CM | POA: Insufficient documentation

## 2017-03-28 NOTE — Progress Notes (Signed)
51 y.o. G110P1001 Married  Caucasian Fe here for annual exam. Periods are changing with light period in 8/18 and no  Period until  now is normal amount today. Periods throughout have been occasional missed one or lighter. Denies any cramping changes. Keeping menses calendar.Treatment for UTI with Cipro and symptoms are resolving. Has switching PCP to Dr. Laurann Montana, next month. Labs here. Seeing dermatology in one week for lip issues. No other health issues today. Request pap smear yearly.  Patient's last menstrual period was 03/25/2017 (exact date).          Sexually active: Yes.    The current method of family planning is vasectomy.    Exercising: No.  exercise Smoker:  no  Health Maintenance: Pap:  05-28-14 neg, 02-25-16 neg HPV HR neg History of Abnormal Pap: no MMG:  01-22-17 category c density birads 1:neg Self Breast exams: occ Colonoscopy:  02-09-17 1 polyp f/u 23yrs BMD:   none TDaP:  2013 Shingles: no Pneumonia: no Hep C and HIV: HIV neg yrs ago, hep c neg 2017 Labs: yes   reports that she has never smoked. She has never used smokeless tobacco. She reports that she does not drink alcohol or use drugs.  Past Medical History:  Diagnosis Date  . Fibroid     Past Surgical History:  Procedure Laterality Date  . MOHS SURGERY     on face for basal cell    Current Outpatient Prescriptions  Medication Sig Dispense Refill  . Calcium Carbonate-Vitamin D (CALCIUM + D PO) Take by mouth as needed.     . Cholecalciferol (VITAMIN D PO) Take 2,000 Int'l Units by mouth as needed.      No current facility-administered medications for this visit.     Family History  Problem Relation Age of Onset  . Osteoporosis Mother   . Cancer Father        melanoma  . Hypertension Father   . Heart attack Father   . Cancer Sister        melanoma  . Cancer Sister        melanoma    ROS:  Pertinent items are noted in HPI.  Otherwise, a comprehensive ROS was negative.  Exam:   BP 114/68   Pulse  64   Resp 16   Ht 4' 8.25" (1.429 m)   Wt 112 lb (50.8 kg)   LMP 03/25/2017 (Exact Date)   BMI 24.89 kg/m  Height: 4' 8.25" (142.9 cm) Ht Readings from Last 3 Encounters:  03/28/17 4' 8.25" (1.429 m)  02/25/16 4' 8.25" (1.429 m)  05/28/14 4\' 8"  (1.422 m)    General appearance: alert, cooperative and appears stated age Head: Normocephalic, without obvious abnormality, atraumatic Neck: no adenopathy, supple, symmetrical, trachea midline and thyroid normal to inspection and palpation Lungs: clear to auscultation bilaterally Breasts: normal appearance, no masses or tenderness, No nipple retraction or dimpling, No nipple discharge or bleeding, No axillary or supraclavicular adenopathy Heart: regular rate and rhythm Abdomen: soft, non-tender; no masses,  no organomegaly Extremities: extremities normal, atraumatic, no cyanosis or edema Skin: Skin color, texture, turgor normal. No rashes or lesions Lymph nodes: Cervical, supraclavicular, and axillary nodes normal. No abnormal inguinal nodes palpated Neurologic: Grossly normal   Pelvic: External genitalia:  no lesions              Urethra:  normal appearing urethra with no masses, tenderness or lesions              Bartholin's  and Skene's: normal                 Vagina: normal appearing vagina with normal color and discharge, no lesions              Cervix: multiparous appearance, no cervical motion tenderness and no lesions              Pap taken: Yes.   Bimanual Exam:  Uterus:  normal size, contour, position, consistency, mobility, non-tender              Adnexa: normal adnexa and no mass, fullness, tenderness               Rectovaginal: Confirms               Anus:  normal sphincter tone, no lesions  A:  Normal well woman exam  Contraception spouse vasectomy  Perimenopausal with cycle changes keeping menses calendar  Screening labs  P:   Reviewed health and wellness pertinent to exam   Aware of need to advise if misses 3 cycles  in a row.  Labs: Lipid panel, CMP, TSH, Vit. D  Pap smear: yes   counseled on breast self exam, mammography screening, menopause, adequate intake of calcium and vitamin D, diet and exercise  return annually or prn  An After Visit Summary was printed and given to the patient.

## 2017-03-28 NOTE — Patient Instructions (Signed)
EXERCISE AND DIET:  We recommended that you start or continue a regular exercise program for good health. Regular exercise means any activity that makes your heart beat faster and makes you sweat.  We recommend exercising at least 30 minutes per day at least 3 days a week, preferably 4 or 5.  We also recommend a diet low in fat and sugar.  Inactivity, poor dietary choices and obesity can cause diabetes, heart attack, stroke, and kidney damage, among others.    ALCOHOL AND SMOKING:  Women should limit their alcohol intake to no more than 7 drinks/beers/glasses of wine (combined, not each!) per week. Moderation of alcohol intake to this level decreases your risk of breast cancer and liver damage. And of course, no recreational drugs are part of a healthy lifestyle.  And absolutely no smoking or even second hand smoke. Most people know smoking can cause heart and lung diseases, but did you know it also contributes to weakening of your bones? Aging of your skin?  Yellowing of your teeth and nails?  CALCIUM AND VITAMIN D:  Adequate intake of calcium and Vitamin D are recommended.  The recommendations for exact amounts of these supplements seem to change often, but generally speaking 600 mg of calcium (either carbonate or citrate) and 800 units of Vitamin D per day seems prudent. Certain women may benefit from higher intake of Vitamin D.  If you are among these women, your doctor will have told you during your visit.    PAP SMEARS:  Pap smears, to check for cervical cancer or precancers,  have traditionally been done yearly, although recent scientific advances have shown that most women can have pap smears less often.  However, every woman still should have a physical exam from her gynecologist every year. It will include a breast check, inspection of the vulva and vagina to check for abnormal growths or skin changes, a visual exam of the cervix, and then an exam to evaluate the size and shape of the uterus and  ovaries.  And after 51 years of age, a rectal exam is indicated to check for rectal cancers. We will also provide age appropriate advice regarding health maintenance, like when you should have certain vaccines, screening for sexually transmitted diseases, bone density testing, colonoscopy, mammograms, etc.   MAMMOGRAMS:  All women over 40 years old should have a yearly mammogram. Many facilities now offer a "3D" mammogram, which may cost around $50 extra out of pocket. If possible,  we recommend you accept the option to have the 3D mammogram performed.  It both reduces the number of women who will be called back for extra views which then turn out to be normal, and it is better than the routine mammogram at detecting truly abnormal areas.    COLONOSCOPY:  Colonoscopy to screen for colon cancer is recommended for all women at age 50.  We know, you hate the idea of the prep.  We agree, BUT, having colon cancer and not knowing it is worse!!  Colon cancer so often starts as a polyp that can be seen and removed at colonscopy, which can quite literally save your life!  And if your first colonoscopy is normal and you have no family history of colon cancer, most women don't have to have it again for 10 years.  Once every ten years, you can do something that may end up saving your life, right?  We will be happy to help you get it scheduled when you are ready.    Be sure to check your insurance coverage so you understand how much it will cost.  It may be covered as a preventative service at no cost, but you should check your particular policy.     Perimenopause Perimenopause is the time when your body begins to move into the menopause (no menstrual period for 12 straight months). It is a natural process. Perimenopause can begin 2-8 years before the menopause and usually lasts for 1 year after the menopause. During this time, your ovaries may or may not produce an egg. The ovaries vary in their production of estrogen and  progesterone hormones each month. This can cause irregular menstrual periods, difficulty getting pregnant, vaginal bleeding between periods, and uncomfortable symptoms. What are the causes?  Irregular production of the ovarian hormones, estrogen and progesterone, and not ovulating every month. Other causes include:  Tumor of the pituitary gland in the brain.  Medical disease that affects the ovaries.  Radiation treatment.  Chemotherapy.  Unknown causes.  Heavy smoking and excessive alcohol intake can bring on perimenopause sooner.  What are the signs or symptoms?  Hot flashes.  Night sweats.  Irregular menstrual periods.  Decreased sex drive.  Vaginal dryness.  Headaches.  Mood swings.  Depression.  Memory problems.  Irritability.  Tiredness.  Weight gain.  Trouble getting pregnant.  The beginning of losing bone cells (osteoporosis).  The beginning of hardening of the arteries (atherosclerosis). How is this diagnosed? Your health care provider will make a diagnosis by analyzing your age, menstrual history, and symptoms. He or she will do a physical exam and note any changes in your body, especially your female organs. Female hormone tests may or may not be helpful depending on the amount of female hormones you produce and when you produce them. However, other hormone tests may be helpful to rule out other problems. How is this treated? In some cases, no treatment is needed. The decision on whether treatment is necessary during the perimenopause should be made by you and your health care provider based on how the symptoms are affecting you and your lifestyle. Various treatments are available, such as:  Treating individual symptoms with a specific medicine for that symptom.  Herbal medicines that can help specific symptoms.  Counseling.  Group therapy.  Follow these instructions at home:  Keep track of your menstrual periods (when they occur, how heavy  they are, how long between periods, and how long they last) as well as your symptoms and when they started.  Only take over-the-counter or prescription medicines as directed by your health care provider.  Sleep and rest.  Exercise.  Eat a diet that contains calcium (good for your bones) and soy (acts like the estrogen hormone).  Do not smoke.  Avoid alcoholic beverages.  Take vitamin supplements as recommended by your health care provider. Taking vitamin E may help in certain cases.  Take calcium and vitamin D supplements to help prevent bone loss.  Group therapy is sometimes helpful.  Acupuncture may help in some cases. Contact a health care provider if:  You have questions about any symptoms you are having.  You need a referral to a specialist (gynecologist, psychiatrist, or psychologist). Get help right away if:  You have vaginal bleeding.  Your period lasts longer than 8 days.  Your periods are recurring sooner than 21 days.  You have bleeding after intercourse.  You have severe depression.  You have pain when you urinate.  You have severe headaches.  You have vision   problems. This information is not intended to replace advice given to you by your health care provider. Make sure you discuss any questions you have with your health care provider. Document Released: 06/22/2004 Document Revised: 10/21/2015 Document Reviewed: 12/12/2012 Elsevier Interactive Patient Education  2017 [employer]  

## 2017-03-28 NOTE — Addendum Note (Signed)
Addended by: Regina Eck on: 03/28/2017 03:47 PM   Modules accepted: Orders

## 2017-03-29 LAB — LIPID PANEL
CHOL/HDL RATIO: 2.9 ratio (ref 0.0–4.4)
Cholesterol, Total: 141 mg/dL (ref 100–199)
HDL: 49 mg/dL (ref >39)
LDL CALC: 73 mg/dL (ref 0–99)
Triglycerides: 96 mg/dL (ref 0–149)
VLDL CHOLESTEROL CAL: 19 mg/dL (ref 5–40)

## 2017-03-29 LAB — COMPREHENSIVE METABOLIC PANEL
ALBUMIN: 4.4 g/dL (ref 3.5–5.5)
ALK PHOS: 50 IU/L (ref 39–117)
ALT: 20 IU/L (ref 0–32)
AST: 18 IU/L (ref 0–40)
Albumin/Globulin Ratio: 2.1 (ref 1.2–2.2)
BILIRUBIN TOTAL: 0.5 mg/dL (ref 0.0–1.2)
BUN / CREAT RATIO: 19 (ref 9–23)
BUN: 12 mg/dL (ref 6–24)
CHLORIDE: 103 mmol/L (ref 96–106)
CO2: 23 mmol/L (ref 20–29)
CREATININE: 0.63 mg/dL (ref 0.57–1.00)
Calcium: 9 mg/dL (ref 8.7–10.2)
GFR calc Af Amer: 121 mL/min/{1.73_m2} (ref >59)
GFR calc non Af Amer: 105 mL/min/{1.73_m2} (ref >59)
GLUCOSE: 77 mg/dL (ref 65–99)
Globulin, Total: 2.1 g/dL (ref 1.5–4.5)
Potassium: 4.3 mmol/L (ref 3.5–5.2)
Sodium: 140 mmol/L (ref 134–144)
Total Protein: 6.5 g/dL (ref 6.0–8.5)

## 2017-03-29 LAB — CBC
HEMATOCRIT: 41 % (ref 34.0–46.6)
HEMOGLOBIN: 14.2 g/dL (ref 11.1–15.9)
MCH: 32.9 pg (ref 26.6–33.0)
MCHC: 34.6 g/dL (ref 31.5–35.7)
MCV: 95 fL (ref 79–97)
Platelets: 232 10*3/uL (ref 150–379)
RBC: 4.31 x10E6/uL (ref 3.77–5.28)
RDW: 13.4 % (ref 12.3–15.4)
WBC: 7.2 10*3/uL (ref 3.4–10.8)

## 2017-03-29 LAB — VITAMIN D 25 HYDROXY (VIT D DEFICIENCY, FRACTURES): VIT D 25 HYDROXY: 32.3 ng/mL (ref 30.0–100.0)

## 2017-03-29 LAB — CYTOLOGY - PAP: Diagnosis: NEGATIVE

## 2017-03-29 LAB — TSH: TSH: 1.81 u[IU]/mL (ref 0.450–4.500)

## 2017-04-02 DIAGNOSIS — K130 Diseases of lips: Secondary | ICD-10-CM | POA: Diagnosis not present

## 2017-05-18 DIAGNOSIS — Z0000 Encounter for general adult medical examination without abnormal findings: Secondary | ICD-10-CM | POA: Diagnosis not present

## 2017-06-18 DIAGNOSIS — J019 Acute sinusitis, unspecified: Secondary | ICD-10-CM | POA: Diagnosis not present

## 2017-06-29 DIAGNOSIS — R350 Frequency of micturition: Secondary | ICD-10-CM | POA: Diagnosis not present

## 2017-07-25 DIAGNOSIS — D2261 Melanocytic nevi of right upper limb, including shoulder: Secondary | ICD-10-CM | POA: Diagnosis not present

## 2017-07-25 DIAGNOSIS — Z86018 Personal history of other benign neoplasm: Secondary | ICD-10-CM | POA: Diagnosis not present

## 2017-07-25 DIAGNOSIS — Z85828 Personal history of other malignant neoplasm of skin: Secondary | ICD-10-CM | POA: Diagnosis not present

## 2017-09-25 ENCOUNTER — Telehealth: Payer: Self-pay | Admitting: Certified Nurse Midwife

## 2017-09-25 NOTE — Telephone Encounter (Signed)
Patient has not had a cycle in 3 months and was told to call.

## 2017-09-25 NOTE — Telephone Encounter (Signed)
Recommend Provera 10 mg x 10 days to see if withdrawal bleeding. She is perimenopausal. Needs to advise if bleeding or no bleeding up two weeks after last dose.

## 2017-09-25 NOTE — Telephone Encounter (Signed)
Spoke with patient. Patient states that her LMP was at the end of December unsure of the exact date. Has not had a menses since. Was advised to contact the office if no menses for 3 months. Advised will review with Anna Stein CNM and return call. Patient is agreeable.

## 2017-09-26 MED ORDER — MEDROXYPROGESTERONE ACETATE 10 MG PO TABS: 10.0000 mg | ORAL_TABLET | Freq: Every day | ORAL | 0 refills | Status: DC

## 2017-09-26 NOTE — Telephone Encounter (Signed)
Spoke with patient. Advised of message as seen below from [place]. Patient verbalizes understanding. Rx for Provera 10 mg daily x 10 days #10 0RF sent to pharmacy on file. Patient will call to report if she has a menses or if she goes 2 weeks without any bleeding after finishing medication.  Routing to provider for final review. Patient agreeable to disposition. Will close encounter.

## 2017-10-12 ENCOUNTER — Telehealth: Payer: Self-pay | Admitting: Certified Nurse Midwife

## 2017-10-12 NOTE — Telephone Encounter (Signed)
Patient finished 10 days of hormone, and she started her cycle the next day and is still on her cycle. She wanted to make sure there was nothing else she needed to do.

## 2017-10-12 NOTE — Telephone Encounter (Signed)
Left message to call Shakevia Sarris at [phone].  

## 2017-10-16 NOTE — Telephone Encounter (Signed)
Spoke with patient. Perimenopausal, completed provera 10mg  x10 days, reports normal menses after.   Advised patient to continue to monitor menses, return call to office if she goes 3 months without a cycle. Melvia Heaps, CNM will review, our office will return call if any additional recommendations provided.   Routing to provider for final review. Patient is agreeable to disposition. Will close encounter.

## 2017-11-11 DIAGNOSIS — J020 Streptococcal pharyngitis: Secondary | ICD-10-CM | POA: Diagnosis not present

## 2018-01-15 ENCOUNTER — Telehealth: Payer: Self-pay | Admitting: Certified Nurse Midwife

## 2018-01-15 MED ORDER — MEDROXYPROGESTERONE ACETATE 10 MG PO TABS: 10.0000 mg | ORAL_TABLET | Freq: Every day | ORAL | 0 refills | Status: DC

## 2018-01-15 NOTE — Telephone Encounter (Signed)
Patient says she has not had a period in 3 months.

## 2018-01-15 NOTE — Telephone Encounter (Signed)
I would recommend Provera again if she had bleeding after last use. And then monitor for another 3 months

## 2018-01-15 NOTE — Telephone Encounter (Signed)
Spoke with patient, advised per Melvia Heaps, CNM. Rx for provera 10mg  x10 days to verified pharmacy. Patient verbalizes understanding and is agreeable. Encounter closed.

## 2018-01-15 NOTE — Telephone Encounter (Signed)
Spoke with patient. No menses in 3 months, LMP  09/2017 after completing provera 10mg  x 10days. Denies any other GYN symptoms or pain. Was advised to call if no menses 3 months.   Advised will review with Melvia Heaps, CNM and return call with recommendations. Patient agreeable.   Melvia Heaps, CNM -please advise

## 2018-01-16 ENCOUNTER — Other Ambulatory Visit: Payer: Self-pay | Admitting: Certified Nurse Midwife

## 2018-01-16 DIAGNOSIS — Z1231 Encounter for screening mammogram for malignant neoplasm of breast: Secondary | ICD-10-CM

## 2018-02-11 ENCOUNTER — Telehealth: Payer: Self-pay | Admitting: Certified Nurse Midwife

## 2018-02-11 NOTE — Telephone Encounter (Signed)
Patient is 2 weeks out from taking 10 day prescription of Provera. Still has not had a cycle.

## 2018-02-11 NOTE — Telephone Encounter (Signed)
Agreeable with plan

## 2018-02-11 NOTE — Telephone Encounter (Signed)
Return call to patient.  Has not had a cycle after provera.  LMP 09/2017.   Advised to call back if no cycle for 3 months to repeat provera. Pt is calendaring cycles.

## 2018-02-18 ENCOUNTER — Ambulatory Visit
Admission: RE | Admit: 2018-02-18 | Discharge: 2018-02-18 | Disposition: A | Discharge status: Home / Self Care | Payer: 59 | Source: Ambulatory Visit | Attending: Certified Nurse Midwife | Admitting: Certified Nurse Midwife

## 2018-02-18 DIAGNOSIS — Z1231 Encounter for screening mammogram for malignant neoplasm of breast: Secondary | ICD-10-CM

## 2018-05-09 ENCOUNTER — Ambulatory Visit (INDEPENDENT_AMBULATORY_CARE_PROVIDER_SITE_OTHER): Payer: 59 | Admitting: Certified Nurse Midwife

## 2018-05-09 ENCOUNTER — Other Ambulatory Visit: Payer: Self-pay

## 2018-05-09 ENCOUNTER — Encounter: Payer: Self-pay | Admitting: Certified Nurse Midwife

## 2018-05-09 VITALS — BP 120/80 | HR 64 | Resp 16 | Ht <= 58 in | Wt 120.0 lb

## 2018-05-09 DIAGNOSIS — R635 Abnormal weight gain: Secondary | ICD-10-CM | POA: Diagnosis not present

## 2018-05-09 DIAGNOSIS — Z0000 Encounter for general adult medical examination without abnormal findings: Secondary | ICD-10-CM

## 2018-05-09 DIAGNOSIS — E559 Vitamin D deficiency, unspecified: Secondary | ICD-10-CM | POA: Diagnosis not present

## 2018-05-09 DIAGNOSIS — Z01419 Encounter for gynecological examination (general) (routine) without abnormal findings: Secondary | ICD-10-CM | POA: Diagnosis not present

## 2018-05-09 DIAGNOSIS — Z124 Encounter for screening for malignant neoplasm of cervix: Secondary | ICD-10-CM

## 2018-05-09 NOTE — Patient Instructions (Signed)

## 2018-05-09 NOTE — Progress Notes (Signed)
52 y.o. G48P1001 Married  Caucasian Fe here for annual exam. Periods irregular over the past year. Used Provera x 2 normal period with first dose, and no period with last one. Occasional hot flash no night sweats or vaginal dryness. Sees Dr. Laurann Montana for aex, labs here. No health issues this year or problems now. Has new granddaughter!!  Patient's last menstrual period was 10/08/2017.          Sexually active: Yes.    The current method of family planning is vasectomy.    Exercising: No.  exercise Smoker:  no  Review of Systems  Constitutional:       Weight gain  HENT: Negative.   Eyes: Negative.   Respiratory: Negative.   Cardiovascular: Negative.   Gastrointestinal: Negative.   Genitourinary:       Menstrual cycle changes  Musculoskeletal: Negative.   Skin: Negative.   Neurological: Negative.   Endo/Heme/Allergies: Negative.   Psychiatric/Behavioral: Negative.     Health Maintenance: Pap:  05-28-14 neg, 02-25-16 neg HPV HR neg, 03-28-17 neg History of Abnormal Pap: no MMG:  02-18-18 category c density birads 1:neg Self Breast exams: no Colonoscopy:  02-09-17 polyp f/u 72yrs BMD:   none TDaP:  2013 Shingles: no Pneumonia: no Hep C and HIV: HIV neg yrs ago, hep c neg 2017 Labs: yes   reports that she has never smoked. She has never used smokeless tobacco. She reports that she does not drink alcohol or use drugs.  Past Medical History:  Diagnosis Date  . Fibroid     Past Surgical History:  Procedure Laterality Date  . MOHS SURGERY     on face for basal cell    Current Outpatient Medications  Medication Sig Dispense Refill  . Calcium Carbonate-Vitamin D (CALCIUM + D PO) Take by mouth as needed.     . Cholecalciferol (VITAMIN D PO) Take 2,000 Int'l Units by mouth as needed.      No current facility-administered medications for this visit.     Family History  Problem Relation Age of Onset  . Osteoporosis Mother   . Cancer Father        melanoma  . Hypertension  Father   . Heart attack Father   . Cancer Sister        melanoma  . Cancer Sister        melanoma    ROS:  Pertinent items are noted in HPI.  Otherwise, a comprehensive ROS was negative.  Exam:   BP 120/80   Pulse 64   Resp 16   Ht 4' 8.25" (1.429 m)   Wt 120 lb (54.4 kg)   LMP 10/08/2017   BMI 26.66 kg/m  Height: 4' 8.25" (142.9 cm) Ht Readings from Last 3 Encounters:  05/09/18 4' 8.25" (1.429 m)  03/28/17 4' 8.25" (1.429 m)  02/25/16 4' 8.25" (1.429 m)    General appearance: alert, cooperative and appears stated age Head: Normocephalic, without obvious abnormality, atraumatic Neck: no adenopathy, supple, symmetrical, trachea midline and thyroid normal to inspection and palpation Lungs: clear to auscultation bilaterally Breasts: normal appearance, no masses or tenderness, No nipple retraction or dimpling, No nipple discharge or bleeding, No axillary or supraclavicular adenopathy Heart: regular rate and rhythm Abdomen: soft, non-tender; no masses,  no organomegaly Extremities: extremities normal, atraumatic, no cyanosis or edema Skin: Skin color, texture, turgor normal. No rashes or lesions Lymph nodes: Cervical, supraclavicular, and axillary nodes normal. No abnormal inguinal nodes palpated Neurologic: Grossly normal   Pelvic: External  genitalia:  no lesions              Urethra:  normal appearing urethra with no masses, tenderness or lesions              Bartholin's and Skene's: normal                 Vagina: normal appearing vagina with normal color and discharge, no lesions              Cervix: multiparous appearance, no cervical motion tenderness and no lesions              Pap taken: Yes.   Bimanual Exam:  Uterus:  normal size, contour, position, consistency, mobility, non-tender              Adnexa: normal adnexa and no mass, fullness, tenderness               Rectovaginal: Confirms               Anus:  normal sphincter tone, no lesions  Chaperone present:  yes  A:  Well Woman with normal exam  Perimenopausal with irregular periods,   Weight gain  History of Vitamin D deficiency  Screening labs    P:   Reviewed health and wellness pertinent to exam  Discussed continue to keep menses calendar and advise if no menses in 3 months. Menopausal once one year with bleeding.  Discussed weight gain and change with hormonal change also.  Lab: Vitamin D  Lab: CBC, CMP,Lipid panel, TSH  Pap smear: yes   counseled on breast self exam, mammography screening, feminine hygiene, menopause, adequate intake of calcium and vitamin D, diet and exercise  return annually or prn  An After Visit Summary was printed and given to the patient.

## 2018-05-10 ENCOUNTER — Other Ambulatory Visit (HOSPITAL_COMMUNITY)
Admission: RE | Admit: 2018-05-10 | Discharge: 2018-05-10 | Disposition: A | Discharge status: Home / Self Care | Payer: 59 | Source: Ambulatory Visit | Attending: Certified Nurse Midwife | Admitting: Certified Nurse Midwife

## 2018-05-10 DIAGNOSIS — Z124 Encounter for screening for malignant neoplasm of cervix: Secondary | ICD-10-CM | POA: Insufficient documentation

## 2018-05-10 LAB — COMPREHENSIVE METABOLIC PANEL
ALT: 29 IU/L (ref 0–32)
AST: 23 IU/L (ref 0–40)
Albumin/Globulin Ratio: 2.3 — ABNORMAL HIGH (ref 1.2–2.2)
Albumin: 4.9 g/dL (ref 3.5–5.5)
Alkaline Phosphatase: 62 IU/L (ref 39–117)
BUN/Creatinine Ratio: 16 (ref 9–23)
BUN: 12 mg/dL (ref 6–24)
Bilirubin Total: 0.7 mg/dL (ref 0.0–1.2)
CALCIUM: 9.3 mg/dL (ref 8.7–10.2)
CO2: 24 mmol/L (ref 20–29)
Chloride: 100 mmol/L (ref 96–106)
Creatinine, Ser: 0.73 mg/dL (ref 0.57–1.00)
GFR calc Af Amer: 110 mL/min/{1.73_m2} (ref >59)
GFR, EST NON AFRICAN AMERICAN: 96 mL/min/{1.73_m2} (ref >59)
Globulin, Total: 2.1 g/dL (ref 1.5–4.5)
Glucose: 78 mg/dL (ref 65–99)
Potassium: 4 mmol/L (ref 3.5–5.2)
Sodium: 139 mmol/L (ref 134–144)
Total Protein: 7 g/dL (ref 6.0–8.5)

## 2018-05-10 LAB — VITAMIN D 25 HYDROXY (VIT D DEFICIENCY, FRACTURES): VIT D 25 HYDROXY: 22.5 ng/mL — AB (ref 30.0–100.0)

## 2018-05-10 LAB — CBC
HEMATOCRIT: 42.2 % (ref 34.0–46.6)
Hemoglobin: 14.6 g/dL (ref 11.1–15.9)
MCH: 33.1 pg — ABNORMAL HIGH (ref 26.6–33.0)
MCHC: 34.6 g/dL (ref 31.5–35.7)
MCV: 96 fL (ref 79–97)
Platelets: 235 10*3/uL (ref 150–450)
RBC: 4.41 x10E6/uL (ref 3.77–5.28)
RDW: 12.2 % — ABNORMAL LOW (ref 12.3–15.4)
WBC: 6.5 10*3/uL (ref 3.4–10.8)

## 2018-05-10 LAB — LIPID PANEL
Chol/HDL Ratio: 3.4 ratio (ref 0.0–4.4)
Cholesterol, Total: 171 mg/dL (ref 100–199)
HDL: 51 mg/dL (ref >39)
LDL Calculated: 98 mg/dL (ref 0–99)
Triglycerides: 108 mg/dL (ref 0–149)
VLDL Cholesterol Cal: 22 mg/dL (ref 5–40)

## 2018-05-10 LAB — TSH: TSH: 1.95 u[IU]/mL (ref 0.450–4.500)

## 2018-05-10 NOTE — Addendum Note (Signed)
Addended by: Regina Eck on: 05/10/2018 09:04 AM   Modules accepted: Orders

## 2018-05-14 ENCOUNTER — Other Ambulatory Visit: Payer: Self-pay | Admitting: Certified Nurse Midwife

## 2018-05-14 DIAGNOSIS — E559 Vitamin D deficiency, unspecified: Secondary | ICD-10-CM

## 2018-05-14 LAB — CYTOLOGY - PAP
DIAGNOSIS: NEGATIVE
HPV: NOT DETECTED

## 2018-06-13 DIAGNOSIS — Z0000 Encounter for general adult medical examination without abnormal findings: Secondary | ICD-10-CM | POA: Diagnosis not present

## 2018-08-14 DIAGNOSIS — D2261 Melanocytic nevi of right upper limb, including shoulder: Secondary | ICD-10-CM | POA: Diagnosis not present

## 2018-08-14 DIAGNOSIS — Z85828 Personal history of other malignant neoplasm of skin: Secondary | ICD-10-CM | POA: Diagnosis not present

## 2018-08-14 DIAGNOSIS — L821 Other seborrheic keratosis: Secondary | ICD-10-CM | POA: Diagnosis not present

## 2018-08-14 DIAGNOSIS — Z86018 Personal history of other benign neoplasm: Secondary | ICD-10-CM | POA: Diagnosis not present

## 2018-08-14 DIAGNOSIS — D485 Neoplasm of uncertain behavior of skin: Secondary | ICD-10-CM | POA: Diagnosis not present

## 2019-01-20 ENCOUNTER — Other Ambulatory Visit: Payer: Self-pay | Admitting: Certified Nurse Midwife

## 2019-01-20 DIAGNOSIS — Z1231 Encounter for screening mammogram for malignant neoplasm of breast: Secondary | ICD-10-CM

## 2019-03-05 ENCOUNTER — Other Ambulatory Visit: Payer: Self-pay

## 2019-03-05 ENCOUNTER — Ambulatory Visit
Admission: RE | Admit: 2019-03-05 | Discharge: 2019-03-05 | Disposition: A | Discharge status: Home / Self Care | Payer: PRIVATE HEALTH INSURANCE | Source: Ambulatory Visit | Attending: Certified Nurse Midwife | Admitting: Certified Nurse Midwife

## 2019-03-05 DIAGNOSIS — Z1231 Encounter for screening mammogram for malignant neoplasm of breast: Secondary | ICD-10-CM

## 2019-05-15 ENCOUNTER — Ambulatory Visit: Payer: 59 | Admitting: Certified Nurse Midwife

## 2019-07-18 ENCOUNTER — Other Ambulatory Visit: Payer: Self-pay

## 2019-07-21 ENCOUNTER — Other Ambulatory Visit (HOSPITAL_COMMUNITY)
Admission: RE | Admit: 2019-07-21 | Discharge: 2019-07-21 | Disposition: A | Discharge status: Home / Self Care | Payer: PRIVATE HEALTH INSURANCE | Source: Ambulatory Visit | Attending: Obstetrics & Gynecology | Admitting: Obstetrics & Gynecology

## 2019-07-21 ENCOUNTER — Ambulatory Visit (INDEPENDENT_AMBULATORY_CARE_PROVIDER_SITE_OTHER): Payer: PRIVATE HEALTH INSURANCE | Admitting: Certified Nurse Midwife

## 2019-07-21 ENCOUNTER — Other Ambulatory Visit: Payer: Self-pay

## 2019-07-21 ENCOUNTER — Encounter: Payer: Self-pay | Admitting: Certified Nurse Midwife

## 2019-07-21 VITALS — BP 110/62 | HR 64 | Temp 98.4°F | Resp 16 | Ht <= 58 in | Wt 122.0 lb

## 2019-07-21 DIAGNOSIS — Z124 Encounter for screening for malignant neoplasm of cervix: Secondary | ICD-10-CM | POA: Diagnosis present

## 2019-07-21 DIAGNOSIS — Z01419 Encounter for gynecological examination (general) (routine) without abnormal findings: Secondary | ICD-10-CM

## 2019-07-21 DIAGNOSIS — Z0000 Encounter for general adult medical examination without abnormal findings: Secondary | ICD-10-CM

## 2019-07-21 DIAGNOSIS — E559 Vitamin D deficiency, unspecified: Secondary | ICD-10-CM | POA: Diagnosis not present

## 2019-07-21 NOTE — Progress Notes (Signed)
54 y.o. G90P1001 Married  Caucasian Fe here for annual exam. No periods since first Provera challenge. Menopausal with vasomotor symptoms still occurring, but has improved. Denies vaginal bleeding or dryness. No pain with sexual activity. Sees Dr Laurann Montana prn. Screening labs desired. Interested in establishing care with another PCP. Would like to up date TDAP, but recently took Covid 19 vaccine, not sure if there is a waiting time. No other health issues today.  Patient's last menstrual period was 10/08/2017.          Sexually active: Yes.    The current method of family planning is vasectomy.    Exercising: No.  exercise Smoker:  no  Review of Systems  Constitutional: Negative.   HENT: Negative.   Eyes: Negative.   Respiratory: Negative.   Cardiovascular: Negative.   Gastrointestinal: Negative.   Genitourinary: Negative.   Musculoskeletal: Negative.   Skin: Negative.   Neurological: Negative.   Endo/Heme/Allergies: Negative.   Psychiatric/Behavioral: Negative.     Health Maintenance: Pap:  03-28-17 neg, 05-10-2018 neg HPV HR neg History of Abnormal Pap: no MMG:  03-06-2019 category c density birads 1:neg Self Breast exams: no Colonoscopy:  02-09-17 polyp f/u 57yrs BMD:   none TDaP:  2013 Shingles: no Pneumonia: no Hep C and HIV: HIV neg yrs ago, Hep c neg 2017 Labs: if needed   reports that she has never smoked. She has never used smokeless tobacco. She reports that she does not drink alcohol or use drugs.  Past Medical History:  Diagnosis Date  . Fibroid     Past Surgical History:  Procedure Laterality Date  . MOHS SURGERY     on face for basal cell    Current Outpatient Medications  Medication Sig Dispense Refill  . Calcium Carbonate-Vitamin D (CALCIUM + D PO) Take by mouth as needed.     . Cholecalciferol (VITAMIN D PO) Take 2,000 Int'l Units by mouth as needed.      No current facility-administered medications for this visit.    Family History  Problem  Relation Age of Onset  . Osteoporosis Mother   . Cancer Father        melanoma  . Hypertension Father   . Heart attack Father   . Cancer Sister        melanoma  . Cancer Sister        melanoma    ROS:  Pertinent items are noted in HPI.  Otherwise, a comprehensive ROS was negative.  Exam:   LMP 10/08/2017    Ht Readings from Last 3 Encounters:  05/09/18 4' 8.25" (1.429 m)  03/28/17 4' 8.25" (1.429 m)  02/25/16 4' 8.25" (1.429 m)    General appearance: alert, cooperative and appears stated age Head: Normocephalic, without obvious abnormality, atraumatic Neck: no adenopathy, supple, symmetrical, trachea midline and thyroid normal to inspection and palpation Lungs: clear to auscultation bilaterally Breasts: normal appearance, no masses or tenderness, No nipple retraction or dimpling, No nipple discharge or bleeding, No axillary or supraclavicular adenopathy Heart: regular rate and rhythm Abdomen: soft, non-tender; no masses,  no organomegaly Extremities: extremities normal, atraumatic, no cyanosis or edema Skin: Skin color, texture, turgor normal. No rashes or lesions Lymph nodes: Cervical, supraclavicular, and axillary nodes normal. No abnormal inguinal nodes palpated Neurologic: Grossly normal   Pelvic: External genitalia:  no lesions              Urethra:  normal appearing urethra with no masses, tenderness or lesions  Bartholin's and Skene's: normal                 Vagina: normal appearing vagina with normal color and discharge, no lesions              Cervix: no cervical motion tenderness, no lesions and normal appearance              Pap taken: Yes.  patient request yearly Bimanual Exam:  Uterus:  normal size, contour, position, consistency, mobility, non-tender and anteverted              Adnexa: normal adnexa and no mass, fullness, tenderness               Rectovaginal: Confirms               Anus:  normal sphincter tone, no lesions  Chaperone present:  yes  A:  Well Woman with normal exam  Menopausal no HRT  TDAP update requested  Recent Covid vaccine  Screening labs today  P:   Reviewed health and wellness pertinent to exam  Discussed need to advise if vaginal bleeding  Will need to call patient once checks on use with recent Covid Vaccine administration.  Labs: CBC, CMP, Lipid panel, TSH, Vitamin D  Pap smear: yes   counseled on breast self exam, mammography screening, feminine hygiene, menopause, adequate intake of calcium and vitamin D, diet and exercise  return annually or prn  An After Visit Summary was printed and given to the patient.

## 2019-07-21 NOTE — Patient Instructions (Signed)

## 2019-07-22 ENCOUNTER — Other Ambulatory Visit: Payer: Self-pay | Admitting: Certified Nurse Midwife

## 2019-07-22 DIAGNOSIS — E559 Vitamin D deficiency, unspecified: Secondary | ICD-10-CM

## 2019-07-22 LAB — COMPREHENSIVE METABOLIC PANEL
ALT: 20 IU/L (ref 0–32)
AST: 19 IU/L (ref 0–40)
Albumin/Globulin Ratio: 2 (ref 1.2–2.2)
Albumin: 4.6 g/dL (ref 3.8–4.9)
Alkaline Phosphatase: 84 IU/L (ref 39–117)
BUN/Creatinine Ratio: 20 (ref 9–23)
BUN: 13 mg/dL (ref 6–24)
Bilirubin Total: 0.5 mg/dL (ref 0.0–1.2)
CO2: 24 mmol/L (ref 20–29)
Calcium: 9.2 mg/dL (ref 8.7–10.2)
Chloride: 104 mmol/L (ref 96–106)
Creatinine, Ser: 0.65 mg/dL (ref 0.57–1.00)
GFR calc Af Amer: 117 mL/min/{1.73_m2} (ref >59)
GFR calc non Af Amer: 102 mL/min/{1.73_m2} (ref >59)
Globulin, Total: 2.3 g/dL (ref 1.5–4.5)
Glucose: 80 mg/dL (ref 65–99)
Potassium: 4 mmol/L (ref 3.5–5.2)
Sodium: 140 mmol/L (ref 134–144)
Total Protein: 6.9 g/dL (ref 6.0–8.5)

## 2019-07-22 LAB — CBC
Hematocrit: 40.6 % (ref 34.0–46.6)
Hemoglobin: 14.6 g/dL (ref 11.1–15.9)
MCH: 34 pg — ABNORMAL HIGH (ref 26.6–33.0)
MCHC: 36 g/dL — ABNORMAL HIGH (ref 31.5–35.7)
MCV: 94 fL (ref 79–97)
Platelets: 250 10*3/uL (ref 150–450)
RBC: 4.3 x10E6/uL (ref 3.77–5.28)
RDW: 11.9 % (ref 11.7–15.4)
WBC: 5.7 10*3/uL (ref 3.4–10.8)

## 2019-07-22 LAB — VITAMIN D 25 HYDROXY (VIT D DEFICIENCY, FRACTURES): Vit D, 25-Hydroxy: 24.4 ng/mL — ABNORMAL LOW (ref 30.0–100.0)

## 2019-07-22 LAB — LIPID PANEL
Chol/HDL Ratio: 3.4 ratio (ref 0.0–4.4)
Cholesterol, Total: 183 mg/dL (ref 100–199)
HDL: 54 mg/dL (ref >39)
LDL Chol Calc (NIH): 109 mg/dL — ABNORMAL HIGH (ref 0–99)
Triglycerides: 114 mg/dL (ref 0–149)
VLDL Cholesterol Cal: 20 mg/dL (ref 5–40)

## 2019-07-22 LAB — TSH: TSH: 1.72 u[IU]/mL (ref 0.450–4.500)

## 2019-07-22 LAB — CYTOLOGY - PAP: Diagnosis: NEGATIVE

## 2019-07-23 ENCOUNTER — Telehealth: Payer: Self-pay

## 2019-07-23 NOTE — Telephone Encounter (Signed)
Left message for call back.

## 2019-07-23 NOTE — Telephone Encounter (Signed)
-----   Message from Regina Eck, CNM sent at 07/22/2019  5:03 PM EST ----- Notify patient her Lipid panel shows good profile slight elevation of LDL only Liver, kidney,glucose profile is normal TSH is normal CBC essentially normal Vitamin D is low at 24.4 will  need to start on Vitamin D3 5000 IU daily and recheck in 3 months. Should be in 40-50 range for bone support Order placed for recheck Pap smear is negative 02

## 2019-07-23 NOTE — Telephone Encounter (Signed)
Patient notified of results. See lab 

## 2019-08-18 ENCOUNTER — Encounter: Payer: Self-pay | Admitting: Certified Nurse Midwife

## 2019-08-26 ENCOUNTER — Other Ambulatory Visit: Payer: Self-pay

## 2019-08-27 ENCOUNTER — Ambulatory Visit (INDEPENDENT_AMBULATORY_CARE_PROVIDER_SITE_OTHER): Payer: PRIVATE HEALTH INSURANCE

## 2019-08-27 VITALS — BP 128/76 | HR 80 | Temp 97.5°F | Resp 10 | Ht <= 58 in | Wt 123.0 lb

## 2019-08-27 DIAGNOSIS — Z23 Encounter for immunization: Secondary | ICD-10-CM | POA: Diagnosis not present

## 2019-08-27 NOTE — Progress Notes (Signed)
Patient in office for a tdap vaccine. Reviewed with patient her allergies and medicines. Per patient, never had any reactions to previous injections. Tdap was administered in left deltoid and tolerated well by patient.  Routing to provider and closing encounter.

## 2019-10-24 ENCOUNTER — Other Ambulatory Visit: Payer: PRIVATE HEALTH INSURANCE

## 2020-01-05 ENCOUNTER — Other Ambulatory Visit: Payer: Self-pay | Admitting: Obstetrics & Gynecology

## 2020-01-05 DIAGNOSIS — Z1231 Encounter for screening mammogram for malignant neoplasm of breast: Secondary | ICD-10-CM

## 2020-03-12 ENCOUNTER — Other Ambulatory Visit: Payer: Self-pay

## 2020-03-12 ENCOUNTER — Ambulatory Visit
Admission: RE | Admit: 2020-03-12 | Discharge: 2020-03-12 | Disposition: A | Discharge status: Home / Self Care | Payer: PRIVATE HEALTH INSURANCE | Source: Ambulatory Visit | Attending: Obstetrics & Gynecology | Admitting: Obstetrics & Gynecology

## 2020-03-12 DIAGNOSIS — Z1231 Encounter for screening mammogram for malignant neoplasm of breast: Secondary | ICD-10-CM

## 2020-12-09 ENCOUNTER — Encounter (HOSPITAL_BASED_OUTPATIENT_CLINIC_OR_DEPARTMENT_OTHER): Payer: Self-pay | Admitting: Obstetrics & Gynecology

## 2020-12-09 ENCOUNTER — Other Ambulatory Visit (HOSPITAL_COMMUNITY)
Admission: RE | Admit: 2020-12-09 | Discharge: 2020-12-09 | Disposition: A | Discharge status: Home / Self Care | Payer: No Typology Code available for payment source | Source: Ambulatory Visit | Attending: Obstetrics & Gynecology | Admitting: Obstetrics & Gynecology

## 2020-12-09 ENCOUNTER — Ambulatory Visit (INDEPENDENT_AMBULATORY_CARE_PROVIDER_SITE_OTHER): Payer: No Typology Code available for payment source | Admitting: Obstetrics & Gynecology

## 2020-12-09 ENCOUNTER — Other Ambulatory Visit: Payer: Self-pay

## 2020-12-09 VITALS — BP 148/78 | HR 86 | Ht <= 58 in | Wt 123.2 lb

## 2020-12-09 DIAGNOSIS — N3090 Cystitis, unspecified without hematuria: Secondary | ICD-10-CM | POA: Diagnosis not present

## 2020-12-09 DIAGNOSIS — Z124 Encounter for screening for malignant neoplasm of cervix: Secondary | ICD-10-CM | POA: Insufficient documentation

## 2020-12-09 DIAGNOSIS — Z01419 Encounter for gynecological examination (general) (routine) without abnormal findings: Secondary | ICD-10-CM | POA: Diagnosis not present

## 2020-12-09 DIAGNOSIS — L659 Nonscarring hair loss, unspecified: Secondary | ICD-10-CM | POA: Diagnosis not present

## 2020-12-09 DIAGNOSIS — E559 Vitamin D deficiency, unspecified: Secondary | ICD-10-CM

## 2020-12-09 DIAGNOSIS — Z780 Asymptomatic menopausal state: Secondary | ICD-10-CM

## 2020-12-09 NOTE — Progress Notes (Addendum)
55 y.o. G66P1001 [name] here for annual exam.  I have seen patient in the past but it has been since about 2015.  She's been followed the past several years by [facility].  Has three girls (two are twin step daughters) and three granddaughters.    Had a UTI about a month ago.  Did have hemorrhagic cystitis 11/13/2020.  Did see blood when she wiped.  Resolved with treatment but was a little nervous about seeing blood.  Is noticing some hair thinning and receeding front hair line.  Woud like some blood work.  Did discuss with dermatologist, briefly, and started vitamin with biotin.  If blood work normal, pt will follow up with Dr. Renda Rolls for additional recommendations.   Denies vaginal bleeding.  Patient's last menstrual period was 10/08/2017.          Sexually active: Yes.    The current method of family planning is post menopausal status.    Exercising: No.   Smoker:  no  Health Maintenance: Pap:  07/20/2019 Negative History of abnormal Pap:  no MMG:  03/12/2020 Negative Colonoscopy:  with Dr. Cristina Gong, about 3 years ago.  Follow up 5 years. BMD:   discussed when to plan TDaP:  07/2019 Shingrix:   discussed today Hep C testing: 01/2016 Screening Labs: does with Dr. Laverna Peace   reports that she has never smoked. She has never used smokeless tobacco. She reports that she does not drink alcohol and does not use drugs.  Past Medical History:  Diagnosis Date   Fibroid     Past Surgical History:  Procedure Laterality Date   MOHS SURGERY     on face for basal cell    Current Outpatient Medications  Medication Sig Dispense Refill   Cholecalciferol (VITAMIN D PO) Take 2,000 Int'l Units by mouth as needed.      Multiple Vitamins-Minerals (ONE-A-DAY WOMENS 50+ PO) Take by mouth.     Calcium Carbonate-Vitamin D (CALCIUM + D PO) Take by mouth as needed.  (Patient not taking: Reported on 12/09/2020)     No current facility-administered medications  for this visit.    Family History  Problem Relation Age of Onset   Osteoporosis Mother    Cancer Father        melanoma   Hypertension Father    Heart attack Father    Cancer Sister        melanoma   Cancer Sister        melanoma    Review of Systems  All other systems reviewed and are negative.  Exam:   BP (!) 148/78 (BP Location: Right Arm, Patient Position: Sitting, Cuff Size: Small)   Pulse 86   Ht 4\' 8"  (1.422 m)   Wt 123 lb 3.2 oz (55.9 kg)   LMP 10/08/2017   BMI 27.62 kg/m   Height: 4\' 8"  (142.2 cm)  General appearance: alert, cooperative and appears stated age Head: Normocephalic, without obvious abnormality, atraumatic Neck: no adenopathy, supple, symmetrical, trachea midline and thyroid normal to inspection and palpation Lungs: clear to auscultation bilaterally Breasts: normal appearance, no masses or tenderness Heart: regular rate and rhythm Abdomen: soft, non-tender; bowel sounds normal; no masses,  no organomegaly Extremities: extremities normal, atraumatic, no cyanosis or edema Skin: Skin color, texture, turgor normal. No rashes or lesions Lymph nodes: Cervical, supraclavicular, and axillary nodes normal. No abnormal inguinal nodes palpated Neurologic: Grossly normal  Pelvic: External genitalia:  pink, growth from inner left labia majora  present, on a stalk, about 66mm              Urethra:  normal appearing urethra with no masses, tenderness or lesions              Bartholins and Skenes: normal                 Vagina: normal appearing vagina with normal color and no discharge, no lesions              Cervix: no lesions              Pap taken: Yes.   Bimanual Exam:  Uterus:  normal size, contour, position, consistency, mobility, non-tender              Adnexa: normal adnexa and no mass, fullness, tenderness               Rectovaginal: Confirms               Anus:  normal sphincter tone, no lesions  Chaperone, Octaviano Batty, CMA, was present for  exam.  Assessment/Plan: 1. Well woman exam with routine gynecological exam - pap and HR HPV obtained today - MMG 03/12/2020 - colonoscopy about 3 years ago.  Follow up 5 years - BMD will be ordered between 55 and 60 - vaccines reviewed - lab work done with Dr. Lindell Noe for screening purposes  2. Vitamin D deficiency - VITAMIN D 25 Hydroxy (Vit-D Deficiency, Fractures)  3. Alopecia - B12 - TSH - Iron, TIBC and Ferritin Panel  4. Cystitis - Urine Culture; Future  5. Postmenopausal - no HRT  6.  Vulvar lesion - finding discussed with pt.  This may just be normal tissue but is not a typical finding.  Feel removal is warranted.

## 2020-12-10 ENCOUNTER — Other Ambulatory Visit (HOSPITAL_BASED_OUTPATIENT_CLINIC_OR_DEPARTMENT_OTHER)
Admission: RE | Admit: 2020-12-10 | Discharge: 2020-12-10 | Disposition: A | Discharge status: Home / Self Care | Payer: No Typology Code available for payment source | Source: Ambulatory Visit | Attending: Obstetrics & Gynecology | Admitting: Obstetrics & Gynecology

## 2020-12-10 DIAGNOSIS — N3090 Cystitis, unspecified without hematuria: Secondary | ICD-10-CM | POA: Insufficient documentation

## 2020-12-11 LAB — URINE CULTURE

## 2020-12-11 LAB — TSH: TSH: 1.33 u[IU]/mL (ref 0.450–4.500)

## 2020-12-11 LAB — IRON,TIBC AND FERRITIN PANEL
Ferritin: 232 ng/mL — ABNORMAL HIGH (ref 15–150)
Iron Saturation: 35 % (ref 15–55)
Iron: 100 ug/dL (ref 27–159)
Total Iron Binding Capacity: 288 ug/dL (ref 250–450)
UIBC: 188 ug/dL (ref 131–425)

## 2020-12-11 LAB — VITAMIN D 25 HYDROXY (VIT D DEFICIENCY, FRACTURES): Vit D, 25-Hydroxy: 37.8 ng/mL (ref 30.0–100.0)

## 2020-12-11 LAB — VITAMIN B12: Vitamin B-12: 588 pg/mL (ref 232–1245)

## 2020-12-12 ENCOUNTER — Encounter (HOSPITAL_BASED_OUTPATIENT_CLINIC_OR_DEPARTMENT_OTHER): Payer: Self-pay

## 2020-12-12 DIAGNOSIS — L659 Nonscarring hair loss, unspecified: Secondary | ICD-10-CM | POA: Insufficient documentation

## 2020-12-12 DIAGNOSIS — Z780 Asymptomatic menopausal state: Secondary | ICD-10-CM | POA: Insufficient documentation

## 2020-12-14 ENCOUNTER — Encounter (HOSPITAL_BASED_OUTPATIENT_CLINIC_OR_DEPARTMENT_OTHER): Payer: Self-pay | Admitting: Obstetrics & Gynecology

## 2020-12-14 ENCOUNTER — Other Ambulatory Visit (HOSPITAL_COMMUNITY)
Admission: RE | Admit: 2020-12-14 | Discharge: 2020-12-14 | Disposition: A | Discharge status: Home / Self Care | Payer: No Typology Code available for payment source | Source: Ambulatory Visit | Attending: Obstetrics & Gynecology | Admitting: Obstetrics & Gynecology

## 2020-12-14 ENCOUNTER — Ambulatory Visit (INDEPENDENT_AMBULATORY_CARE_PROVIDER_SITE_OTHER): Payer: No Typology Code available for payment source | Admitting: Obstetrics & Gynecology

## 2020-12-14 ENCOUNTER — Other Ambulatory Visit: Payer: Self-pay

## 2020-12-14 VITALS — BP 154/76 | HR 79 | Wt 125.0 lb

## 2020-12-14 DIAGNOSIS — Z87440 Personal history of urinary (tract) infections: Secondary | ICD-10-CM | POA: Diagnosis not present

## 2020-12-14 DIAGNOSIS — D280 Benign neoplasm of vulva: Secondary | ICD-10-CM | POA: Insufficient documentation

## 2020-12-14 DIAGNOSIS — L659 Nonscarring hair loss, unspecified: Secondary | ICD-10-CM

## 2020-12-14 LAB — CYTOLOGY - PAP: Diagnosis: NEGATIVE

## 2020-12-16 LAB — URINE CULTURE

## 2020-12-16 LAB — SURGICAL PATHOLOGY

## 2020-12-16 NOTE — Progress Notes (Signed)
GYNECOLOGY  VISIT  CC:   discuss urine, removal of vulvar lesion  HPI: 55 y.o. G23P1001 [name] here for excision of vulvar lesion noted at appointment last week.  Pt reports she did go home and look at this in the mirror and it is not something she's ever seen so is very glad to be here for removal.  Consent obtained.    Urine culture done last week due to hx of recent UTI.  Multiple species of bacteria present.  Pt and I discussed either repeating urine culture or treating and repeating culture.  Will just proceed with repeat culture today.  Denies any new urinary symptoms.  Reviewed lab work from last week.  Pt does want testosterone level obtained today if possible.    GYNECOLOGIC HISTORY: Patient's last menstrual period was 10/08/2017. 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  (Patient not taking: Reported on 12/09/2020)     Cholecalciferol (VITAMIN D PO) Take 2,000 Int'l Units by mouth as needed.      Multiple Vitamins-Minerals (ONE-A-DAY WOMENS 50+ PO) Take by mouth.     No current facility-administered medications on file prior to visit.    ALLERGIES: Augmentin [amoxicillin-pot clavulanate] and Daypro [oxaprozin]  Family History  Problem Relation Age of Onset   Osteoporosis Mother    Cancer Father        melanoma    Hypertension Father    Heart attack Father    Cancer Sister        melanoma   Cancer Sister        melanoma    SH:  married, non smoker  Review of Systems  Constitutional: Negative.   Genitourinary: Negative.    PHYSICAL EXAMINATION:    BP (!) 154/76 (BP Location: Right Arm, Patient Position: Sitting, Cuff Size: Small)   Pulse 79   Wt 125 lb (56.7 kg)   LMP 10/08/2017   BMI 28.02 kg/m     General appearance: alert, cooperative and appears stated age Lymph:  no inguinal LAD noted  Pelvic: External genitalia:  39mm lesion on left inner labia majora, non tender, no bleeding               Procedure:  Area cleansed with Betadine.  Sterile technique used throughout procedure.  Skin anesthestized with Lidocaine 1% plain; 1.7mL.   Using sterile instruments, lesion fully excised at base by grasping with pick-ups and excising with scissors.  Adequate hemostasis obtained with silver nitrate sticks.  Dressing was not applied.  Pt tolerated procedure well.  Chaperone, Octaviano Batty, CMA, was present for exam.  Assessment/Plan: 1. History of cystitis - Urine Culture  2. Alopecia - Testosterone, Total, LC/MS/MS  3. Benign neoplasm labia minora - Surgical pathology( [place]/ [facility])

## 2020-12-16 NOTE — Addendum Note (Signed)
Addended by: Megan Salon on: 12/16/2020 04:34 AM   Modules accepted: Orders

## 2020-12-17 LAB — TESTOSTERONE, TOTAL, LC/MS/MS: Testosterone, total: 16.2 ng/dL

## 2021-02-09 ENCOUNTER — Encounter (HOSPITAL_BASED_OUTPATIENT_CLINIC_OR_DEPARTMENT_OTHER): Payer: Self-pay | Admitting: *Deleted

## 2021-05-06 ENCOUNTER — Other Ambulatory Visit: Payer: Self-pay | Admitting: Obstetrics & Gynecology

## 2021-05-06 DIAGNOSIS — Z1231 Encounter for screening mammogram for malignant neoplasm of breast: Secondary | ICD-10-CM

## 2021-06-07 ENCOUNTER — Ambulatory Visit
Admission: RE | Admit: 2021-06-07 | Discharge: 2021-06-07 | Disposition: A | Discharge status: Home / Self Care | Payer: No Typology Code available for payment source | Source: Ambulatory Visit | Attending: Obstetrics & Gynecology | Admitting: Obstetrics & Gynecology

## 2021-06-07 DIAGNOSIS — Z1231 Encounter for screening mammogram for malignant neoplasm of breast: Secondary | ICD-10-CM

## 2021-08-06 IMAGING — MG DIGITAL SCREENING BILAT W/ TOMO W/ CAD
8 series · 9 of 24 positions shown · non-contrast
Comparison: Previous exam(s).

CLINICAL DATA: Screening.

EXAM:
DIGITAL SCREENING BILATERAL MAMMOGRAM WITH TOMO AND CAD

[R MLO synth-2D]
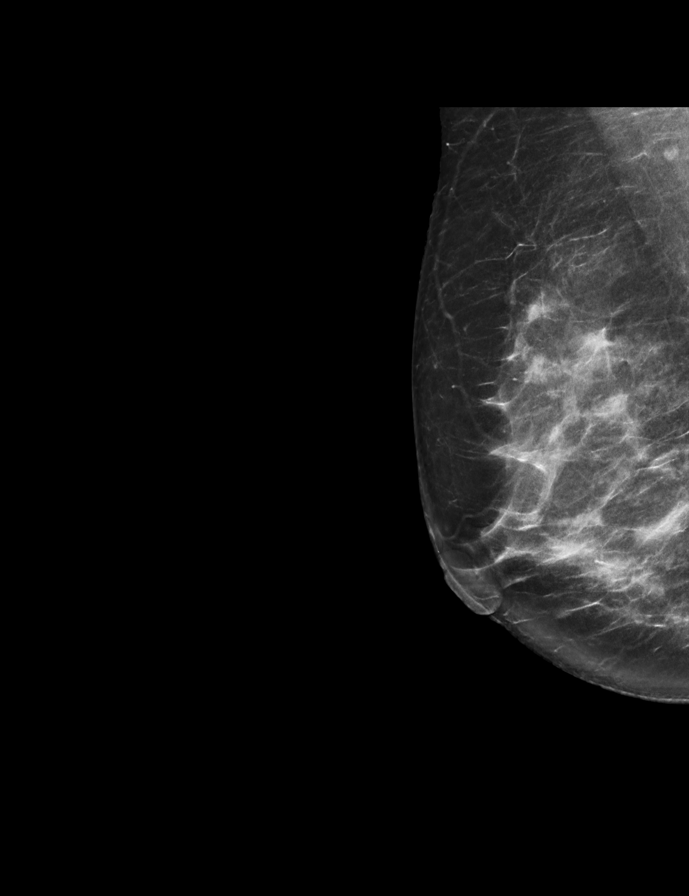

[L CC synth-2D]
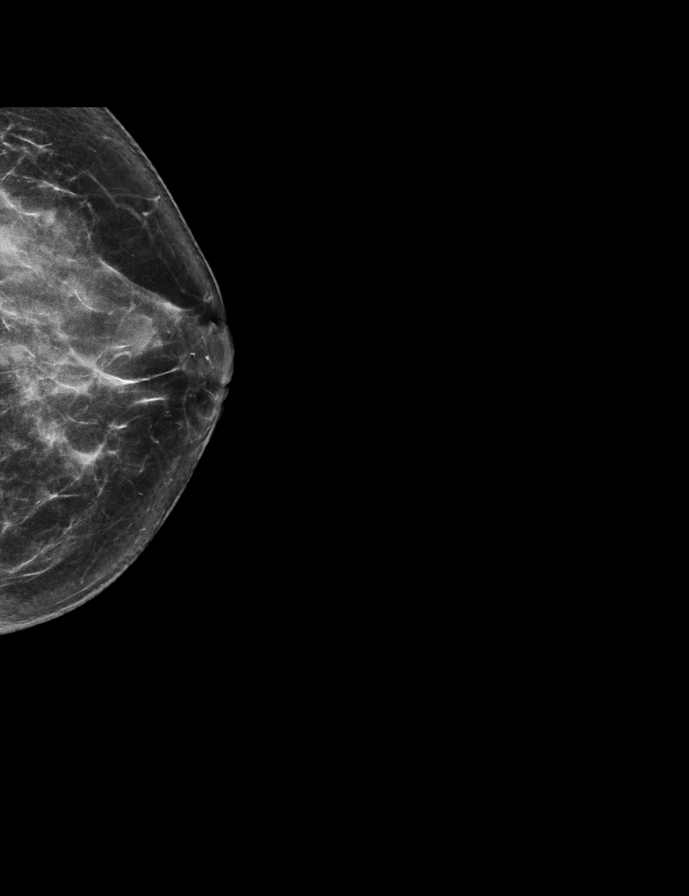

[R CC synth-2D]
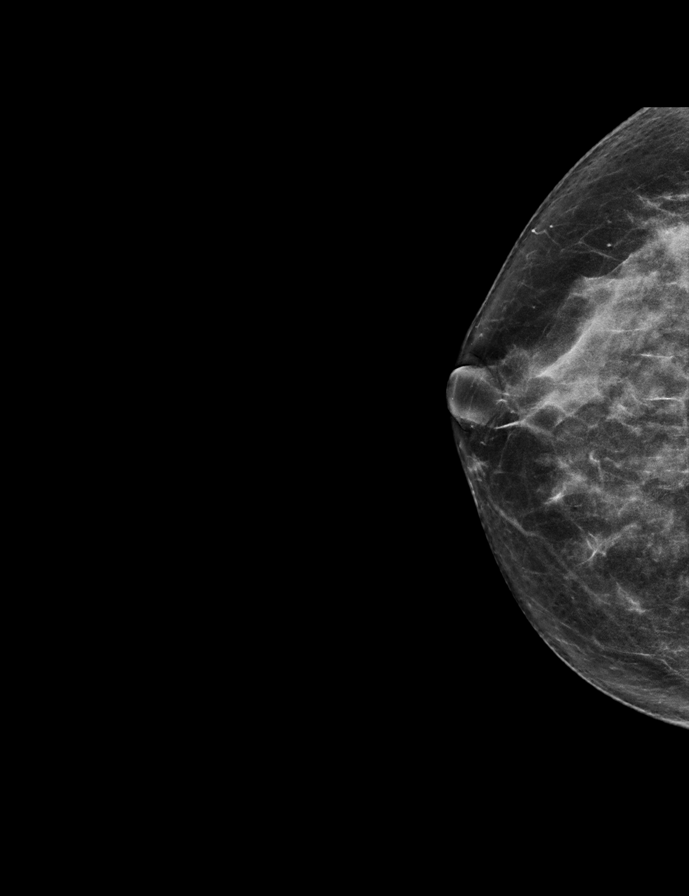

[L MLO synth-2D]
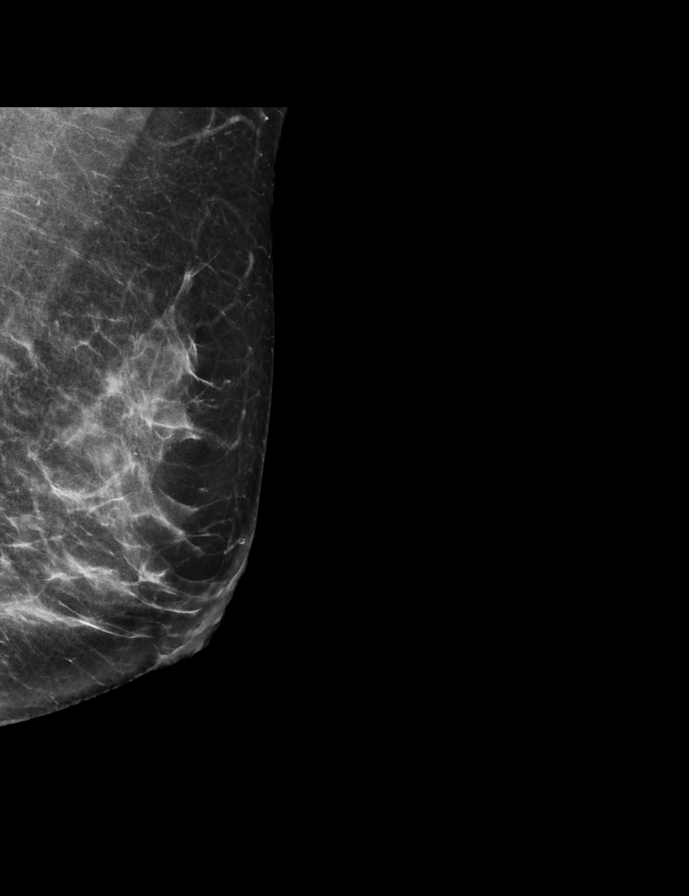

[L MLO tomo · 2 of 64 frames shown]
[frame 21/64]
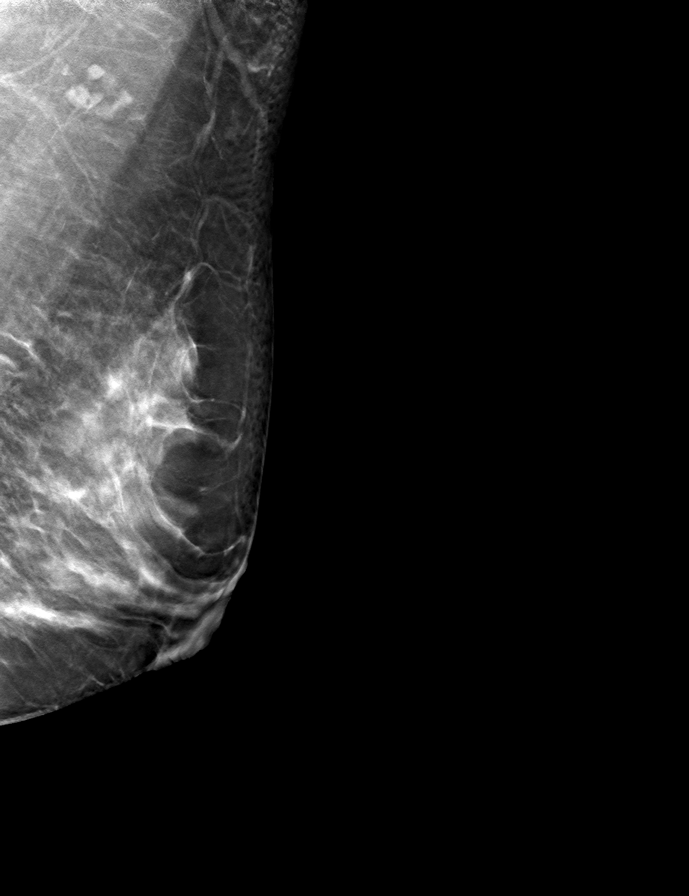
[frame 33/64]
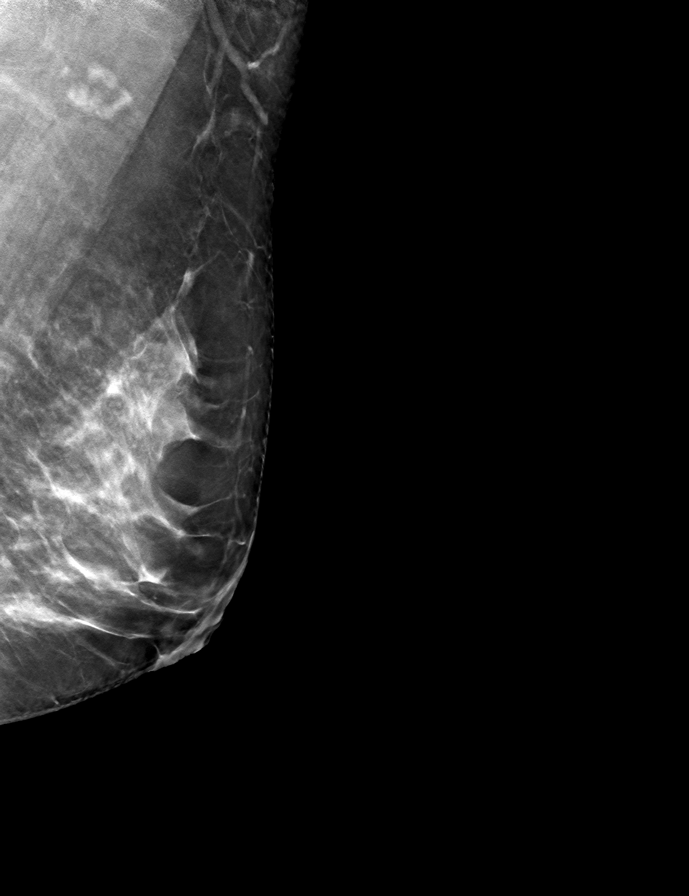

[L CC tomo · tomo slice 33/65.0]
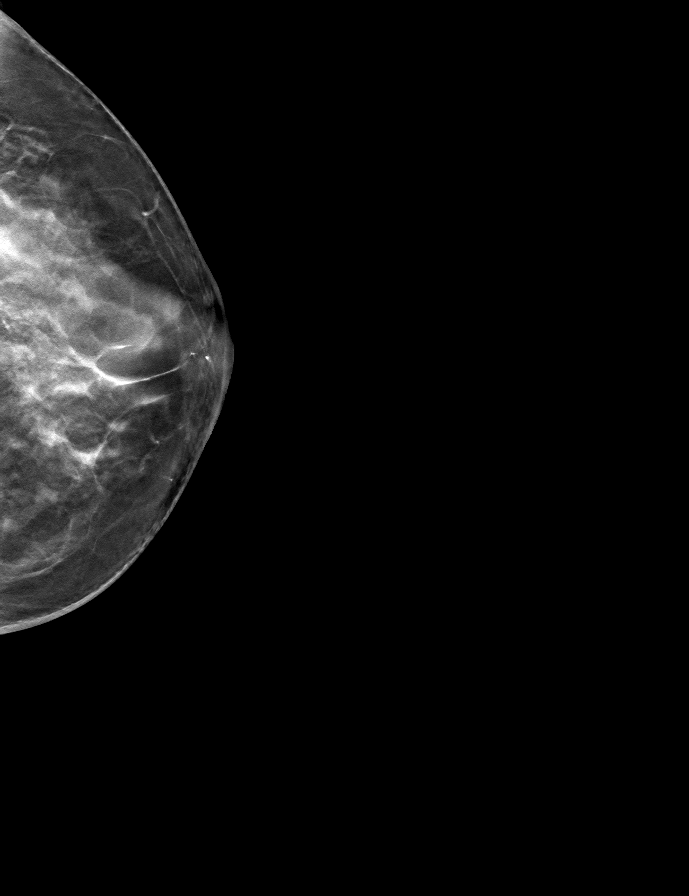

[R CC tomo · tomo slice 32/63.0]
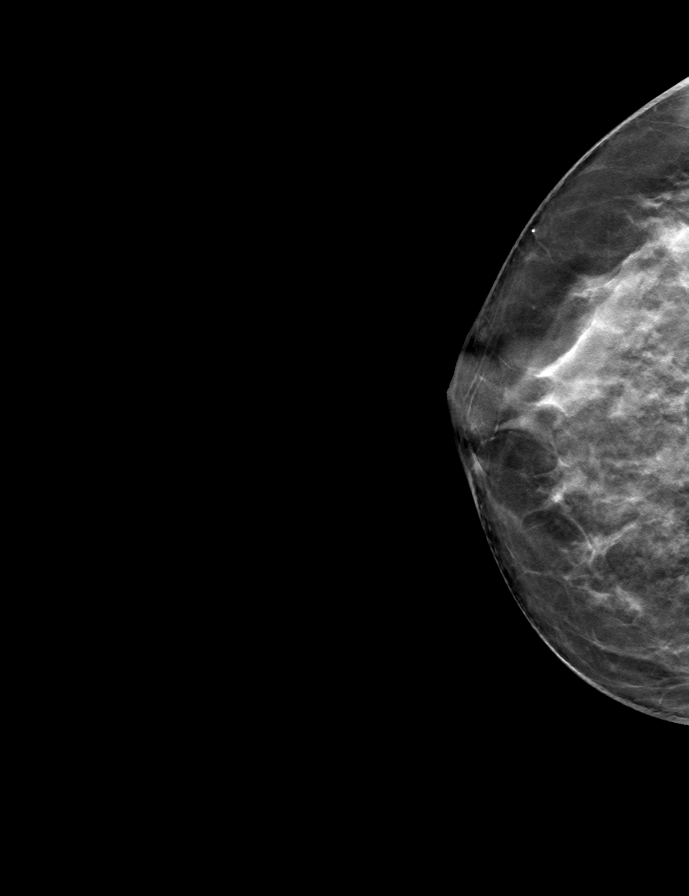

[R MLO tomo · tomo slice 35/68.0]
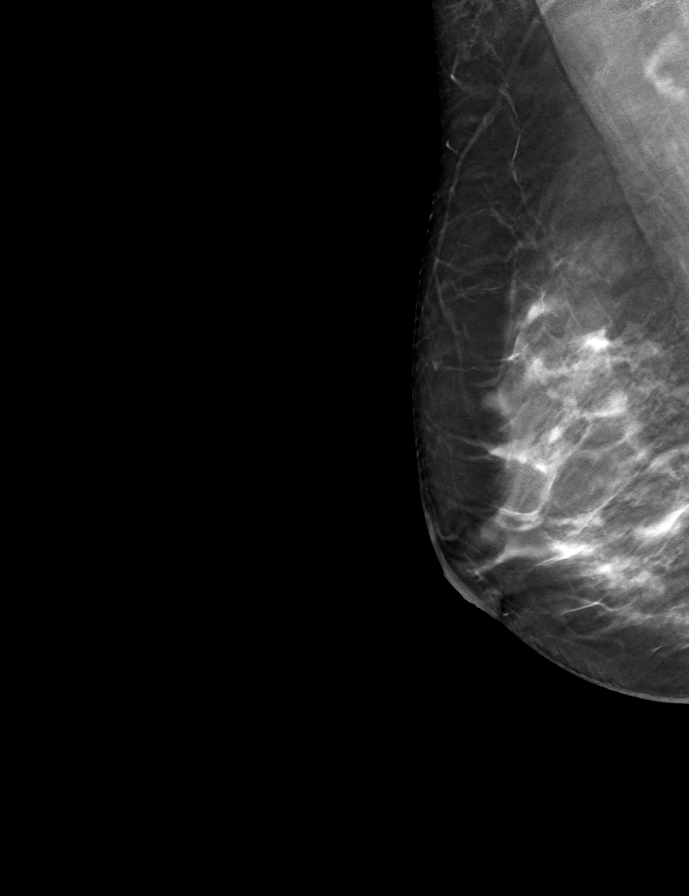

[9 of 24 positions shown; findings below may reference images not displayed]

ACR Breast Density Category c: The breast tissue is heterogeneously
dense, which may obscure small masses.
FINDINGS: There are no findings suspicious for malignancy. Images were
processed with CAD.
IMPRESSION: No mammographic evidence of malignancy. A result letter of this
screening mammogram will be mailed directly to the patient.

RECOMMENDATION:
Screening mammogram in one year. (Code:FT-U-LHB)

BI-RADS CATEGORY  1: Negative.

## 2021-12-13 ENCOUNTER — Ambulatory Visit (HOSPITAL_BASED_OUTPATIENT_CLINIC_OR_DEPARTMENT_OTHER): Payer: No Typology Code available for payment source | Admitting: Obstetrics & Gynecology

## 2022-01-03 ENCOUNTER — Encounter: Payer: No Typology Code available for payment source | Admitting: Physical Therapy

## 2022-01-03 ENCOUNTER — Ambulatory Visit: Payer: No Typology Code available for payment source | Admitting: Physical Therapy

## 2022-01-10 ENCOUNTER — Ambulatory Visit: Payer: No Typology Code available for payment source | Admitting: Physical Therapy

## 2022-01-17 NOTE — Therapy (Signed)
OUTPATIENT PHYSICAL THERAPY VESTIBULAR EVALUATION     Patient Name: Anna Stein MRN: [id number] DOB:August 04, 1965, 56 y.o., female Today's Date: 01/17/2022  PCP: Glenis Smoker, MD REFERRING PROVIDER: Glenis Smoker, MD    Past Medical History:  Diagnosis Date   Fibroid    Past Surgical History:  Procedure Laterality Date   MOHS SURGERY     on face for basal cell   Patient Active Problem List   Diagnosis Date Noted   Alopecia 12/12/2020   Postmenopausal 12/12/2020   Cataract 03/28/2017   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   Carpal tunnel syndrome on left 05/03/2015   Carpal tunnel syndrome on right 05/03/2015   Malformation of bone and joint 05/03/2015    ONSET DATE: 12/29/21  REFERRING DIAG: R42 (ICD-10-CM) - Dizziness and giddiness  THERAPY DIAG:  No diagnosis found.  Rationale for Evaluation and Treatment Rehabilitation  SUBJECTIVE:   SUBJECTIVE STATEMENT: 3 weeks before dizziness started she had a terrible cold. 3 weeks ago today, she woke up with vertigo. Went to see PCP who gave her some meds without improvement. Saw ENT who diagnosed her with BPPV which may have brought on a "brain tantrum/vestibular migraine." Once she got back to work, symptoms returned. Started Prednisone on 01/17/22 and is starting to feel better. Episodes last seconds-minutes and are described as spinning/sensation of movement. Worse with turning and notes sensation of movement of the bed at night or when sitting still. Denies head trauma, hearing loss, otalgia, photo/phonophobia, hx of migraines. Reports decreased clarity with vision, long hx of tinnitus but with recent worsening in the L ear. Has tried the Epley 2x at home.  Pt accompanied by: self  PERTINENT HISTORY: none   PAIN:  Are you having pain? No  PRECAUTIONS: None  WEIGHT BEARING RESTRICTIONS No  FALLS: Has patient fallen in last 6 months? No  LIVING ENVIRONMENT: Lives with: lives with their  family Lives in: House/apartment Stairs:  5 steps to get inside Has following equipment at home: None  PLOF: Independent; works as a [employer] manager  PATIENT GOALS improve dizziness  OBJECTIVE:   DIAGNOSTIC FINDINGS: none recent  COGNITION: Overall cognitive status: Within functional limits for tasks assessed   SENSATION: WFL   POSTURE: No Significant postural limitations   GAIT: Gait pattern: WFL; patient reports sensation of pulsion to 1 side Assistive device utilized: None Level of assistance: Complete Independence   PATIENT SURVEYS:  FOTO [phone]   VESTIBULAR ASSESSMENT   GENERAL OBSERVATION: patient wears contacts for distance and readers    OCULOMOTOR EXAM:   Ocular Alignment: normal   Ocular ROM: No Limitations   Spontaneous Nystagmus: absent   Gaze-Induced Nystagmus: absent   Smooth Pursuits:  1-2 saccades in vertical direction and slightly slowed   Saccades: intact   Convergence/Divergence: 1 cm    VESTIBULAR - OCULAR REFLEX:    Slow VOR: Comment: slightly slow in horizontal and vertical directions; no dizziness    VOR Cancellation: Normal   Head-Impulse Test: HIT Right: positive HIT Left: covert positive      POSITIONAL TESTING:  Right Roll Test: possible apogeotropic nystagmus lasting ~20 sec vs. Saccadic intrusions; no dizziness  Left Roll Test: negative Right Dix-Hallpike: R upbeating torsional nystagmus; Duration:20 sec; no dizziness  Left Dix-Hallpike: negative    VESTIBULAR TREATMENT:  Canalith Repositioning:   Epley Right: Number of Reps: 1 and Response to Treatment: symptoms improved; upon L head turn, patient with L upbeating torsional nystagmus for ~15 sec which then changed to L beating  nystagmus for 15 sec without dizziness   PATIENT EDUCATION: Education details: prognosis, POC, HEP, edu on exam findings Person educated: Patient Education method: Explanation, Demonstration, Tactile cues, Verbal cues, and Handouts Education  comprehension: verbalized understanding  GOALS: Goals reviewed with patient? Yes  SHORT TERM GOALS: Target date: 02/02/2022  Patient to be independent with initial HEP. Baseline: HEP initiated Goal status: INITIAL    LONG TERM GOALS: Target date: 02/16/2022  Patient to be independent with advanced HEP. Baseline: Not yet initiated  Goal status: INITIAL  Patient will report 0/10 dizziness with bed mobility.  Baseline: Symptomatic  Goal status: INITIAL  Patient to demonstrate mild sway with M-CTSIB condition with eyes closed/foam surface in order to improve safety in environments with uneven surfaces and dim lighting. Baseline: NT Goal status: INITIAL  Patient to score at least 20/24 on DGI in order to decrease risk of falls. Baseline: NT Goal status: INITIAL  Patient will report ability to perform quick turns without c/o symptoms. Baseline: Unable Goal status: INITIAL     ASSESSMENT:  CLINICAL IMPRESSION:   Patient is a 56 y/o F presenting to OPPT with c/o dizziness for the past 3 weeks. Was seen by ENT who diagnosed her with BPPV and  "brain tantrum/vestibular migraine." Now feeling better since starting Prednisone taper 2 days ago. Denies head trauma, hearing loss, otalgia, photo/phonophobia, hx of migraines. Reports decreased clarity with vision, long hx of tinnitus but with recent worsening in the L ear. Oculomotor exam revealed saccades and slightly slowed smooth pursuit, positive HIT R>L. Positional testing was asymptomatic however with R upbeating torsional nystagmus evident with R DH, treated with R Epley. During Epley, patient with L upbeating torsional nystagmus upon L head turn, suggestive of possible L posterior canalithiasis vs. effects of vestibular migraine/migraine induced vestibulopathy. Patient was educated on gentle VOR and habituation HEP and reported understanding. Would benefit from skilled PT services 1x/week for 4 weeks to address aforementioned  impairments in order to optimize level of function.    OBJECTIVE IMPAIRMENTS decreased balance and dizziness.   ACTIVITY LIMITATIONS standing, squatting, stairs, transfers, and bed mobility  PARTICIPATION LIMITATIONS: meal prep, cleaning, laundry, driving, shopping, community activity, occupation, yard work, and church  PERSONAL FACTORS Age, Fitness, Past/current experiences, Profession, and Time since onset of injury/illness/exacerbation are also affecting patient's functional outcome.   REHAB POTENTIAL: Good  CLINICAL DECISION MAKING: Evolving/moderate complexity  EVALUATION COMPLEXITY: Moderate   PLAN: PT FREQUENCY: 1x/week  PT DURATION: 4 weeks  PLANNED INTERVENTIONS: Therapeutic exercises, Therapeutic activity, Neuromuscular re-education, Balance training, Gait training, Patient/Family education, Self Care, Joint mobilization, Stair training, Vestibular training, Canalith repositioning, Aquatic Therapy, Dry Needling, Electrical stimulation, Cryotherapy, Moist heat, Taping, Manual therapy, and Re-evaluation  PLAN FOR NEXT SESSION: review HEP, MCTSIB, DGI, multisensory balance training, VOR/turning activities    Janene Harvey, PT, DPT 01/19/22 9:58 AM  [facility] [address] [place], [place] [address] Phone # [phone] Fax # [phone]

## 2022-01-19 ENCOUNTER — Ambulatory Visit: Payer: No Typology Code available for payment source | Attending: Family Medicine | Admitting: Physical Therapy

## 2022-01-19 ENCOUNTER — Encounter: Payer: Self-pay | Admitting: Physical Therapy

## 2022-01-19 ENCOUNTER — Other Ambulatory Visit: Payer: Self-pay

## 2022-01-19 DIAGNOSIS — H8113 Benign paroxysmal vertigo, bilateral: Secondary | ICD-10-CM | POA: Diagnosis present

## 2022-01-19 DIAGNOSIS — R42 Dizziness and giddiness: Secondary | ICD-10-CM | POA: Insufficient documentation

## 2022-01-19 NOTE — Therapy (Signed)
OUTPATIENT PHYSICAL THERAPY VESTIBULAR EVALUATION     Patient Name: Anna Stein MRN: [id number] DOB:[birth year], 56 y.o., female Today's Date: 01/19/2022  PCP: Glenis Smoker, MD REFERRING PROVIDER: Glenis Smoker, MD   PT End of Session - 01/19/22 [phone]     Visit Number 1    Number of Visits 5    Date for PT Re-Evaluation 02/16/22    Authorization Type [employer]    Authorization - Number of Visits 30   combined   PT Start Time 0802    PT Stop Time 0848    PT Time Calculation (min) 46 min    Activity Tolerance Patient tolerated treatment well    Behavior During Therapy [facility] for tasks assessed/performed             Past Medical History:  Diagnosis Date   Fibroid    Past Surgical History:  Procedure Laterality Date   MOHS SURGERY     on face for basal cell   Patient Active Problem List   Diagnosis Date Noted   Alopecia 12/12/2020   Postmenopausal 12/12/2020   Cataract 03/28/2017   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   Carpal tunnel syndrome on left 05/03/2015   Carpal tunnel syndrome on right 05/03/2015   Malformation of bone and joint 05/03/2015    ONSET DATE: 12/29/21  REFERRING DIAG: R42 (ICD-10-CM) - Dizziness and giddiness  THERAPY DIAG:  BPPV (benign paroxysmal positional vertigo), bilateral  Dizziness and giddiness  Rationale for Evaluation and Treatment Rehabilitation  SUBJECTIVE:   SUBJECTIVE STATEMENT: 3 weeks before dizziness started she had a terrible cold. 3 weeks ago today, she woke up with vertigo. Went to see PCP who gave her some meds without improvement. Saw ENT who diagnosed her with BPPV which may have brought on a "brain tantrum/vestibular migraine." Once she got back to work, symptoms returned. Started Prednisone on 01/17/22 and is starting to feel better. Episodes last seconds-minutes and are described as spinning/sensation of movement. Worse with turning and notes sensation of movement of the bed at night or  when sitting still. Denies head trauma, hearing loss, otalgia, photo/phonophobia, hx of migraines. Reports decreased clarity with vision, long hx of tinnitus but with recent worsening in the L ear. Has tried the Epley 2x at home.  Pt accompanied by: self  PERTINENT HISTORY: none   PAIN:  Are you having pain? No  PRECAUTIONS: None  WEIGHT BEARING RESTRICTIONS No  FALLS: Has patient fallen in last 6 months? No  LIVING ENVIRONMENT: Lives with: lives with their family Lives in: House/apartment Stairs:  5 steps to get inside Has following equipment at home: None  PLOF: Independent; works as a [employer]arry manager  PATIENT GOALS improve dizziness  OBJECTIVE:   DIAGNOSTIC FINDINGS: none recent  COGNITION: Overall cognitive status: Within functional limits for tasks assessed   SENSATION: WFL   POSTURE: No Significant postural limitations   GAIT: Gait pattern: WFL; patient reports sensation of pulsion to 1 side Assistive device utilized: None Level of assistance: Complete Independence   PATIENT SURVEYS:  FOTO [phone]   VESTIBULAR ASSESSMENT   GENERAL OBSERVATION: patient wears contacts for distance and readers    OCULOMOTOR EXAM:   Ocular Alignment: normal   Ocular ROM: No Limitations   Spontaneous Nystagmus: absent   Gaze-Induced Nystagmus: absent   Smooth Pursuits:  1-2 saccades in vertical direction and slightly slowed   Saccades: intact   Convergence/Divergence: 1 cm    VESTIBULAR - OCULAR REFLEX:    Slow VOR: Comment: slightly  slow in horizontal and vertical directions; no dizziness    VOR Cancellation: Normal   Head-Impulse Test: HIT Right: positive HIT Left: covert positive      POSITIONAL TESTING:  Right Roll Test: possible apogeotropic nystagmus lasting ~20 sec vs. Saccadic intrusions; no dizziness  Left Roll Test: negative Right Dix-Hallpike: R upbeating torsional nystagmus; Duration:20 sec; no dizziness  Left Dix-Hallpike: negative    VESTIBULAR  TREATMENT:  Canalith Repositioning:   Epley Right: Number of Reps: 1 and Response to Treatment: symptoms improved; upon L head turn, patient with L upbeating torsional nystagmus for ~15 sec which then changed to L beating nystagmus for 15 sec without dizziness   PATIENT EDUCATION: Education details: prognosis, POC, HEP, edu on exam findings Person educated: Patient Education method: Explanation, Demonstration, Tactile cues, Verbal cues, and Handouts Education comprehension: verbalized understanding  GOALS: Goals reviewed with patient? Yes  SHORT TERM GOALS: Target date: 02/02/2022  Patient to be independent with initial HEP. Baseline: HEP initiated Goal status: INITIAL    LONG TERM GOALS: Target date: 02/16/2022  Patient to be independent with advanced HEP. Baseline: Not yet initiated  Goal status: INITIAL  Patient will report 0/10 dizziness with bed mobility.  Baseline: Symptomatic  Goal status: INITIAL  Patient to demonstrate mild sway with M-CTSIB condition with eyes closed/foam surface in order to improve safety in environments with uneven surfaces and dim lighting. Baseline: NT Goal status: INITIAL  Patient to score at least 20/24 on DGI in order to decrease risk of falls. Baseline: NT Goal status: INITIAL  Patient will report ability to perform quick turns without c/o symptoms. Baseline: Unable Goal status: INITIAL     ASSESSMENT:  CLINICAL IMPRESSION:   Patient is a 56 y/o F presenting to OPPT with c/o dizziness for the past 3 weeks. Was seen by ENT who diagnosed her with BPPV and  "brain tantrum/vestibular migraine." Now feeling better since starting Prednisone taper 2 days ago. Denies head trauma, hearing loss, otalgia, photo/phonophobia, hx of migraines. Reports decreased clarity with vision, long hx of tinnitus but with recent worsening in the L ear. Oculomotor exam revealed saccades and slightly slowed smooth pursuit, positive HIT R>L. Positional testing  was asymptomatic however with R upbeating torsional nystagmus evident with R DH, treated with R Epley. During Epley, patient with L upbeating torsional nystagmus upon L head turn, suggestive of possible L posterior canalithiasis vs. effects of vestibular migraine/migraine induced vestibulopathy. Patient was educated on gentle VOR and habituation HEP and reported understanding. Would benefit from skilled PT services 1x/week for 4 weeks to address aforementioned impairments in order to optimize level of function.    OBJECTIVE IMPAIRMENTS decreased balance and dizziness.   ACTIVITY LIMITATIONS standing, squatting, stairs, transfers, and bed mobility  PARTICIPATION LIMITATIONS: meal prep, cleaning, laundry, driving, shopping, community activity, occupation, yard work, and church  PERSONAL FACTORS Age, Fitness, Past/current experiences, Profession, and Time since onset of injury/illness/exacerbation are also affecting patient's functional outcome.   REHAB POTENTIAL: Good  CLINICAL DECISION MAKING: Evolving/moderate complexity  EVALUATION COMPLEXITY: Moderate   PLAN: PT FREQUENCY: 1x/week  PT DURATION: 4 weeks  PLANNED INTERVENTIONS: Therapeutic exercises, Therapeutic activity, Neuromuscular re-education, Balance training, Gait training, Patient/Family education, Self Care, Joint mobilization, Stair training, Vestibular training, Canalith repositioning, Aquatic Therapy, Dry Needling, Electrical stimulation, Cryotherapy, Moist heat, Taping, Manual therapy, and Re-evaluation  PLAN FOR NEXT SESSION: review HEP, MCTSIB, DGI, multisensory balance training, VOR/turning activities    Janene Harvey, PT, DPT 01/19/22 9:59 AM   Outpatient Rehab at  [facility] [address] [place], [place] [address] Phone # [phone] Fax # [phone]

## 2022-01-26 ENCOUNTER — Encounter: Payer: Self-pay | Admitting: Physical Therapy

## 2022-01-26 ENCOUNTER — Ambulatory Visit: Payer: No Typology Code available for payment source | Admitting: Physical Therapy

## 2022-01-26 DIAGNOSIS — H8113 Benign paroxysmal vertigo, bilateral: Secondary | ICD-10-CM | POA: Diagnosis not present

## 2022-01-26 DIAGNOSIS — R42 Dizziness and giddiness: Secondary | ICD-10-CM

## 2022-01-26 NOTE — Therapy (Addendum)
OUTPATIENT PHYSICAL THERAPY VESTIBULAR TREATMENT     Patient Name: Anna Stein MRN: [id number] DOB:1965-08-14, 56 y.o., female Today's Date: 01/26/2022  PCP: Glenis Smoker, MD REFERRING PROVIDER: Glenis Smoker, MD   PT End of Session - 01/26/22 [phone]     Visit Number 2    Number of Visits 5    Date for PT Re-Evaluation 02/16/22    Authorization Type [employer]    Authorization - Number of Visits 30   combined   PT Start Time 0800    PT Stop Time 0846    PT Time Calculation (min) 46 min    Equipment Utilized During Treatment Gait belt    Activity Tolerance Patient tolerated treatment well    Behavior During Therapy [facility] for tasks assessed/performed              Past Medical History:  Diagnosis Date   Fibroid    Past Surgical History:  Procedure Laterality Date   MOHS SURGERY     on face for basal cell   Patient Active Problem List   Diagnosis Date Noted   Alopecia 12/12/2020   Postmenopausal 12/12/2020   Cataract 03/28/2017   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   Carpal tunnel syndrome on left 05/03/2015   Carpal tunnel syndrome on right 05/03/2015   Malformation of bone and joint 05/03/2015    ONSET DATE: 12/29/21  REFERRING DIAG: R42 (ICD-10-CM) - Dizziness and giddiness  THERAPY DIAG:  BPPV (benign paroxysmal positional vertigo), bilateral  Dizziness and giddiness  Rationale for Evaluation and Treatment Rehabilitation  SUBJECTIVE:   SUBJECTIVE STATEMENT: Finished Prednisone on Monday and got an episode of the room starting to spin while brushing her hair. This episode lasted minutes. Head is still feeling weird. Monday was the worst day this week. Denies HA during this episode but does report a HA a couple days ago and episode of "flickering lights" while in a meeting. Reports some dizziness with VOR exercise. 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   Pt accompanied by:  self  PERTINENT HISTORY: none   PAIN:  Are you having pain? No  PRECAUTIONS: None  PATIENT GOALS improve dizziness  OBJECTIVE:     TODAY'S TREATMENT: 01/26/22 Activity Comments  R/L brandt daroff 3x each EO, EO quick, EC PT assist to increase pace; c/o mild wooziness with EC  Sitting VOR horizontal 30", standing VOR horizontal 30", standing in front of blinds 30" Cueing to increase pace; no dizziness but report of blurred vision; no worse in front of blinds   1/2 turns to targets  Good quick pace; c/o very slight dizziness  D2 flexion ball reaching ball to cone 10x each Good quick pace; no dizziness; tendency to hold head in slight extension      [facility] PT Assessment - 01/26/22 0001       Standardized Balance Assessment   Standardized Balance Assessment Dynamic Gait Index      Dynamic Gait Index   Level Surface Normal    Change in Gait Speed Normal    Gait with Horizontal Head Turns Normal    Gait with Vertical Head Turns Normal    Gait and Pivot Turn Normal    Step Over Obstacle Normal    Step Around Obstacles Normal    Steps Normal    Total Score 24               M-CTSIB  Condition 1: Firm  Surface, EO 30 Sec, Normal Sway  Condition 2: Firm Surface, EC 30 Sec, Normal Sway  Condition 3: Foam Surface, EO 30 Sec, Normal and Mild Sway  Condition 4: Foam Surface, EC 30 Sec, Moderate and Severe Sway      HOME EXERCISE PROGRAM Last updated: 01/26/22 Access Code: [email or web address] URL: [email or web address] Date: 01/26/2022 Prepared by: [facility]  Exercises - Brandt-Daroff Vestibular Exercise  - 1 x daily - 5 x weekly - 2 sets - 3-5 reps - Romberg Stance Eyes Closed on Foam Pad  - 1 x daily - 5 x weekly - 2-3 sets - 30 sec hold - Standing Gaze Stabilization with Head Rotation  - 1 x daily - 5 x weekly - 2-3 sets - 30 sec hold - Standing VOR Cancellation  - 1 x daily - 5 x weekly - 2-3 sets - 30 sec hold - Standing  Gaze Stabilization with Head Nod  - 1 x daily - 5 x weekly - 2-3 sets - 30 sec hold    PATIENT EDUCATION: Education details: edu on exam findings and how it affects function/diagnosis, discussed possibility of Neurology workup if no response to vestibular rehab. Person educated: Patient Education method: Explanation, Demonstration, Tactile cues, Verbal cues, and Handouts Education comprehension: verbalized understanding and returned demonstration   Below measures were taken at time of initial evaluation unless otherwise specified:  DIAGNOSTIC FINDINGS: none recent  COGNITION: Overall cognitive status: Within functional limits for tasks assessed   SENSATION: WFL   POSTURE: No Significant postural limitations   GAIT: Gait pattern: WFL; patient reports sensation of pulsion to 1 side Assistive device utilized: None Level of assistance: Complete Independence   PATIENT SURVEYS:  FOTO [phone]   VESTIBULAR ASSESSMENT   GENERAL OBSERVATION: patient wears contacts for distance and readers    OCULOMOTOR EXAM:   Ocular Alignment: normal   Ocular ROM: No Limitations   Spontaneous Nystagmus: absent   Gaze-Induced Nystagmus: absent   Smooth Pursuits:  1-2 saccades in vertical direction and slightly slowed   Saccades: intact   Convergence/Divergence: 1 cm    VESTIBULAR - OCULAR REFLEX:    Slow VOR: Comment: slightly slow in horizontal and vertical directions; no dizziness    VOR Cancellation: Normal   Head-Impulse Test: HIT Right: positive HIT Left: covert positive      POSITIONAL TESTING:  Right Roll Test: possible apogeotropic nystagmus lasting ~20 sec vs. Saccadic intrusions; no dizziness  Left Roll Test: negative Right Dix-Hallpike: R upbeating torsional nystagmus; Duration:20 sec; no dizziness  Left Dix-Hallpike: negative    VESTIBULAR TREATMENT:  Canalith Repositioning:   Epley Right: Number of Reps: 1 and Response to Treatment: symptoms improved; upon L head  turn, patient with L upbeating torsional nystagmus for ~15 sec which then changed to L beating nystagmus for 15 sec without dizziness   PATIENT EDUCATION: Education details: prognosis, POC, HEP, edu on exam findings Person educated: Patient Education method: Explanation, Demonstration, Tactile cues, Verbal cues, and Handouts Education comprehension: verbalized understanding  GOALS: Goals reviewed with patient? Yes  SHORT TERM GOALS: Target date: 02/02/2022  Patient to be independent with initial HEP. Baseline: HEP initiated Goal status: INITIAL    LONG TERM GOALS: Target date: 02/16/2022  Patient to be independent with advanced HEP. Baseline: Not yet initiated  Goal status: IN PROGRESS  Patient will report 0/10 dizziness with bed mobility.  Baseline: Symptomatic  Goal status: IN PROGRESS  Patient to demonstrate mild sway with M-CTSIB condition  with eyes closed/foam surface in order to improve safety in environments with uneven surfaces and dim lighting. Baseline: NT Goal status: IN PROGRESS  Patient to score at least 20/24 on DGI in order to decrease risk of falls. Baseline: NT Goal status: IN PROGRESS  Patient will report ability to perform quick turns without c/o symptoms. Baseline: Unable Goal status: IN PROGRESS     ASSESSMENT:  CLINICAL IMPRESSION: Patient arrived to session with report of an episode of dizziness on Monday while brushing her hair. Reports still feeling off since. Denies HA during this episode but does note a HA in the days after as well as a perception of "flickering lights." Patient scored well on DGI, indicating decreased risk of falls. Balance on compliant surface and EC way more challenging and indicated decreased use of vestibular system for balance. Reviewed HEP and provided increased challenges including quicker speed and EC. No increased symptoms with optokinetic challenges. Discussed possibility of Neurology workup if no response to vestibular  rehab. Patient reported understanding of edu provided and without complaints at end of session.    OBJECTIVE IMPAIRMENTS decreased balance and dizziness.   ACTIVITY LIMITATIONS standing, squatting, stairs, transfers, and bed mobility  PARTICIPATION LIMITATIONS: meal prep, cleaning, laundry, driving, shopping, community activity, occupation, yard work, and church  PERSONAL FACTORS Age, Fitness, Past/current experiences, Profession, and Time since onset of injury/illness/exacerbation are also affecting patient's functional outcome.   REHAB POTENTIAL: Good  CLINICAL DECISION MAKING: Evolving/moderate complexity  EVALUATION COMPLEXITY: Moderate   PLAN: PT FREQUENCY: 1x/week  PT DURATION: 4 weeks  PLANNED INTERVENTIONS: Therapeutic exercises, Therapeutic activity, Neuromuscular re-education, Balance training, Gait training, Patient/Family education, Self Care, Joint mobilization, Stair training, Vestibular training, Canalith repositioning, Aquatic Therapy, Dry Needling, Electrical stimulation, Cryotherapy, Moist heat, Taping, Manual therapy, and Re-evaluation  PLAN FOR NEXT SESSION: review HEP, multisensory balance training, VOR/turning activities    Janene Harvey, PT, DPT 01/26/22 8:59 AM  [facility] at [facility] [address], [place], [place] [address] Phone # [phone] Fax # [phone]   PHYSICAL THERAPY DISCHARGE SUMMARY  Visits from Start of Care: 2  Current functional level related to goals / functional outcomes: Unable to assess; patient did not return   Remaining deficits: Unable to assess   Education / Equipment: HEP  Plan: Patient agrees to discharge.  Patient goals were not met. Patient is being discharged due to not returning.     Janene Harvey, PT, DPT 04/19/22 3:45 PM  [facility] at [facility], [place], [place] [address] Phone # [phone] Fax # [phone]

## 2022-02-06 ENCOUNTER — Encounter: Payer: No Typology Code available for payment source | Admitting: Physical Therapy

## 2022-02-08 ENCOUNTER — Other Ambulatory Visit (HOSPITAL_COMMUNITY)
Admission: RE | Admit: 2022-02-08 | Discharge: 2022-02-08 | Disposition: A | Discharge status: Home / Self Care | Payer: No Typology Code available for payment source | Source: Ambulatory Visit | Attending: Obstetrics & Gynecology | Admitting: Obstetrics & Gynecology

## 2022-02-08 ENCOUNTER — Ambulatory Visit (INDEPENDENT_AMBULATORY_CARE_PROVIDER_SITE_OTHER): Payer: No Typology Code available for payment source

## 2022-02-08 DIAGNOSIS — R300 Dysuria: Secondary | ICD-10-CM | POA: Diagnosis not present

## 2022-02-08 DIAGNOSIS — N898 Other specified noninflammatory disorders of vagina: Secondary | ICD-10-CM

## 2022-02-08 LAB — POCT URINALYSIS DIPSTICK
Bilirubin, UA: NEGATIVE
Blood, UA: NEGATIVE
Glucose, UA: NEGATIVE
Ketones, UA: NEGATIVE
Nitrite, UA: NEGATIVE
Protein, UA: NEGATIVE
Spec Grav, UA: >=1.03 — AB (ref 1.010–1.025)
Urobilinogen, UA: 0.2 E.U./dL
pH, UA: 5 (ref 5.0–8.0)

## 2022-02-08 MED ORDER — FLUCONAZOLE 150 MG PO TABS: 150.0000 mg | ORAL_TABLET | Freq: Once | ORAL | 0 refills | Status: AC

## 2022-02-08 MED ORDER — PHENAZOPYRIDINE HCL 200 MG PO TABS
200.0000 mg | ORAL_TABLET | Freq: Three times a day (TID) | ORAL | 0 refills | Status: DC | PRN
Start: 2022-02-08 — End: 2022-04-10

## 2022-02-08 MED ORDER — NITROFURANTOIN MONOHYD MACRO 100 MG PO CAPS
100.0000 mg | ORAL_CAPSULE | Freq: Two times a day (BID) | ORAL | 0 refills | Status: DC
Start: 2022-02-08 — End: 2022-04-10

## 2022-02-08 NOTE — Addendum Note (Signed)
Addended by: Octaviano Batty B on: 02/08/2022 09:21 AM   Modules accepted: Orders

## 2022-02-08 NOTE — Progress Notes (Signed)
Patient came in today with complaints of vaginal discharge and itching. She also states that she has some burning when urinating. Patient gave an aptima swab sample and a urine sample to be evaluated. Per protocol, I called in Diflucan '150mg'$  one by mouth X1, Macrobid '100mg'$  BID for 5 days and Pyridium '200mg'$  by mouth TID for 2 days. Rx sent to [facility]. tbw

## 2022-02-09 LAB — CERVICOVAGINAL ANCILLARY ONLY
Bacterial Vaginitis (gardnerella): NEGATIVE
Candida Glabrata: NEGATIVE
Candida Vaginitis: NEGATIVE
Comment: NEGATIVE
Comment: NEGATIVE
Comment: NEGATIVE

## 2022-02-09 LAB — URINE CULTURE: Organism ID, Bacteria: NO GROWTH

## 2022-03-16 ENCOUNTER — Ambulatory Visit (HOSPITAL_BASED_OUTPATIENT_CLINIC_OR_DEPARTMENT_OTHER): Payer: No Typology Code available for payment source | Admitting: Obstetrics & Gynecology

## 2022-04-10 ENCOUNTER — Other Ambulatory Visit (HOSPITAL_COMMUNITY)
Admission: RE | Admit: 2022-04-10 | Discharge: 2022-04-10 | Disposition: A | Discharge status: Home / Self Care | Payer: No Typology Code available for payment source | Source: Ambulatory Visit | Attending: Obstetrics & Gynecology | Admitting: Obstetrics & Gynecology

## 2022-04-10 ENCOUNTER — Encounter (HOSPITAL_BASED_OUTPATIENT_CLINIC_OR_DEPARTMENT_OTHER): Payer: Self-pay | Admitting: Obstetrics & Gynecology

## 2022-04-10 ENCOUNTER — Ambulatory Visit (INDEPENDENT_AMBULATORY_CARE_PROVIDER_SITE_OTHER): Payer: No Typology Code available for payment source | Admitting: Obstetrics & Gynecology

## 2022-04-10 VITALS — BP 150/79 | HR 70

## 2022-04-10 DIAGNOSIS — Z124 Encounter for screening for malignant neoplasm of cervix: Secondary | ICD-10-CM | POA: Insufficient documentation

## 2022-04-10 DIAGNOSIS — Z01419 Encounter for gynecological examination (general) (routine) without abnormal findings: Secondary | ICD-10-CM | POA: Diagnosis not present

## 2022-04-10 DIAGNOSIS — R3915 Urgency of urination: Secondary | ICD-10-CM

## 2022-04-10 DIAGNOSIS — R030 Elevated blood-pressure reading, without diagnosis of hypertension: Secondary | ICD-10-CM | POA: Diagnosis not present

## 2022-04-10 DIAGNOSIS — Z13820 Encounter for screening for osteoporosis: Secondary | ICD-10-CM

## 2022-04-10 NOTE — Progress Notes (Signed)
56 y.o. G66P1001 [name] here for annual exam.  Doing well.    Patient's last menstrual period was 10/08/2017.          Sexually active: Yes.    The current method of family planning is post menopausal status.    Exercising: no Smoker:  no  Health Maintenance: Pap:  12/09/2020 Negative History of abnormal Pap:  no MMG:  06/07/2021 Negative Colonoscopy:  02/09/2017 follow up 5 years.  Dr. Cristina Gong. BMD:   will order this year Screening Labs: done early this year   reports that she has never smoked. She has never used smokeless tobacco. She reports that she does not drink alcohol and does not use drugs.  Past Medical History:  Diagnosis Date   Fibroid     Past Surgical History:  Procedure Laterality Date   MOHS SURGERY     on face for basal cell    Current Outpatient Medications  Medication Sig Dispense Refill   Cholecalciferol (VITAMIN D PO) Take 2,000 Int'l Units by mouth as needed.      Multiple Vitamins-Minerals (ONE-A-DAY WOMENS 50+ PO) Take by mouth.     No current facility-administered medications for this visit.    Family History  Problem Relation Age of Onset   Osteoporosis Mother    Cancer Father        melanoma   Hypertension Father    Heart attack Father    Cancer Sister        melanoma   Cancer Sister        melanoma    ROS: Constitutional: negative Genitourinary:negative  Exam:   BP (!) 150/79 (BP Location: Right Arm, Patient Position: Sitting, Cuff Size: Large)   Pulse 70   LMP 10/08/2017      General appearance: alert, cooperative and appears stated age Head: Normocephalic, without obvious abnormality, atraumatic Neck: no adenopathy, supple, symmetrical, trachea midline and thyroid normal to inspection and palpation Lungs: clear to auscultation bilaterally Breasts: normal appearance, no masses or tenderness Heart: regular rate and rhythm Abdomen: soft, non-tender; bowel sounds normal; no masses,  no  organomegaly Extremities: extremities normal, atraumatic, no cyanosis or edema Skin: Skin color, texture, turgor normal. No rashes or lesions Lymph nodes: Cervical, supraclavicular, and axillary nodes normal. No abnormal inguinal nodes palpated Neurologic: Grossly normal   Pelvic: External genitalia:  no lesions              Urethra:  normal appearing urethra with no masses, tenderness or lesions              Bartholins and Skenes: normal                 Vagina: normal appearing vagina with normal color and no discharge, no lesions              Cervix: no lesions              Pap taken: No. Bimanual Exam:  Uterus:  normal size, contour, position, consistency, mobility, non-tender              Adnexa: normal adnexa and no mass, fullness, tenderness               Rectovaginal: Confirms               Anus:  normal sphincter tone, no lesions  Chaperone, Octaviano Batty, CMA, was present for exam.  Assessment/Plan: 1. Well woman exam with routine gynecological exam - Pap smear  neg 2022 with neg HR HPV - Mammogram 05/2021 - Colonoscopy 01/2021 - Bone mineral density screening discussed - lab work done with PCP, Dr. Lindell Noe - vaccines reviewed/updated  2. Elevated blood pressure reading - Dr. Lindell Noe is monitoring and having follow up after the holidays  3. Urinary urgency - pt is going to leave urine sample for culture with any new symptoms  4. Cervical cancer screening - Cytology - PAP( [place])  5. Osteoporosis screening - DG BONE DENSITY (DXA); Future

## 2022-04-13 LAB — CYTOLOGY - PAP
Comment: NEGATIVE
Diagnosis: NEGATIVE
High risk HPV: NEGATIVE

## 2022-06-20 ENCOUNTER — Other Ambulatory Visit: Payer: Self-pay | Admitting: Obstetrics & Gynecology

## 2022-06-20 DIAGNOSIS — Z1231 Encounter for screening mammogram for malignant neoplasm of breast: Secondary | ICD-10-CM

## 2022-08-08 ENCOUNTER — Ambulatory Visit
Admission: RE | Admit: 2022-08-08 | Discharge: 2022-08-08 | Disposition: A | Discharge status: Home / Self Care | Payer: No Typology Code available for payment source | Source: Ambulatory Visit | Attending: Obstetrics & Gynecology | Admitting: Obstetrics & Gynecology

## 2022-08-08 DIAGNOSIS — Z1231 Encounter for screening mammogram for malignant neoplasm of breast: Secondary | ICD-10-CM

## 2022-08-11 ENCOUNTER — Ambulatory Visit (INDEPENDENT_AMBULATORY_CARE_PROVIDER_SITE_OTHER): Payer: No Typology Code available for payment source | Admitting: *Deleted

## 2022-08-11 ENCOUNTER — Other Ambulatory Visit (HOSPITAL_COMMUNITY)
Admission: RE | Admit: 2022-08-11 | Discharge: 2022-08-11 | Disposition: A | Discharge status: Home / Self Care | Payer: No Typology Code available for payment source | Source: Ambulatory Visit | Attending: Obstetrics & Gynecology | Admitting: Obstetrics & Gynecology

## 2022-08-11 DIAGNOSIS — N949 Unspecified condition associated with female genital organs and menstrual cycle: Secondary | ICD-10-CM

## 2022-08-11 DIAGNOSIS — R102 Pelvic and perineal pain: Secondary | ICD-10-CM | POA: Diagnosis not present

## 2022-08-11 LAB — POCT URINALYSIS DIPSTICK
Appearance: NORMAL
Bilirubin, UA: NEGATIVE
Blood, UA: NEGATIVE
Glucose, UA: NEGATIVE
Ketones, UA: NEGATIVE
Leukocytes, UA: NEGATIVE
Nitrite, UA: NEGATIVE
Protein, UA: NEGATIVE
Urobilinogen, UA: 0.2 E.U./dL
pH, UA: 5.5 (ref 5.0–8.0)

## 2022-08-11 NOTE — Progress Notes (Signed)
Pt presents to office with complaints of feeling pelvic pressure and vaginal discomfort. She is unsure if she has a UTI or yeast infection. Pt instructed on and performed self swab and clean catch urine. Urine dip negative. Advised pt that we would wait to see what the test results show before treating. Pt verbalized understanding.

## 2022-08-13 LAB — URINE CULTURE

## 2022-08-14 ENCOUNTER — Other Ambulatory Visit (HOSPITAL_BASED_OUTPATIENT_CLINIC_OR_DEPARTMENT_OTHER): Payer: Self-pay | Admitting: *Deleted

## 2022-08-14 LAB — CERVICOVAGINAL ANCILLARY ONLY
Bacterial Vaginitis (gardnerella): NEGATIVE
Candida Glabrata: NEGATIVE
Candida Vaginitis: NEGATIVE
Comment: NEGATIVE
Comment: NEGATIVE
Comment: NEGATIVE

## 2022-08-14 MED ORDER — AMOXICILLIN 500 MG PO CAPS: 500.0000 mg | ORAL_CAPSULE | Freq: Four times a day (QID) | ORAL | 0 refills | Status: AC

## 2022-08-14 NOTE — Progress Notes (Signed)
Rx sent to pharmacy for +GBS in urine

## 2022-08-17 ENCOUNTER — Ambulatory Visit (HOSPITAL_BASED_OUTPATIENT_CLINIC_OR_DEPARTMENT_OTHER): Payer: No Typology Code available for payment source

## 2022-08-17 ENCOUNTER — Encounter (HOSPITAL_BASED_OUTPATIENT_CLINIC_OR_DEPARTMENT_OTHER): Payer: Self-pay | Admitting: Obstetrics & Gynecology

## 2022-08-17 DIAGNOSIS — R300 Dysuria: Secondary | ICD-10-CM | POA: Diagnosis not present

## 2022-08-17 LAB — POCT URINALYSIS DIPSTICK
Bilirubin, UA: NEGATIVE
Glucose, UA: NEGATIVE
Leukocytes, UA: NEGATIVE
Nitrite, UA: NEGATIVE
Protein, UA: NEGATIVE
Spec Grav, UA: 1.025 (ref 1.010–1.025)
Urobilinogen, UA: 0.2 E.U./dL
pH, UA: 7 (ref 5.0–8.0)

## 2022-08-17 NOTE — Progress Notes (Signed)
Patient came in today for repeat urine sample. Patient states she has done 3 days AZO and still feels like the symptoms are there. Pain while urinating and urgency. tbw

## 2022-08-18 ENCOUNTER — Other Ambulatory Visit (HOSPITAL_BASED_OUTPATIENT_CLINIC_OR_DEPARTMENT_OTHER): Payer: Self-pay | Admitting: Obstetrics & Gynecology

## 2022-08-18 ENCOUNTER — Encounter (HOSPITAL_BASED_OUTPATIENT_CLINIC_OR_DEPARTMENT_OTHER): Payer: Self-pay | Admitting: Obstetrics & Gynecology

## 2022-08-18 DIAGNOSIS — R102 Pelvic and perineal pain: Secondary | ICD-10-CM

## 2022-08-18 MED ORDER — FLUCONAZOLE 150 MG PO TABS: ORAL_TABLET | ORAL | 0 refills | Status: DC

## 2022-08-18 MED ORDER — NITROFURANTOIN MONOHYD MACRO 100 MG PO CAPS: 100.0000 mg | ORAL_CAPSULE | Freq: Two times a day (BID) | ORAL | 0 refills | Status: DC

## 2022-08-24 ENCOUNTER — Encounter (HOSPITAL_BASED_OUTPATIENT_CLINIC_OR_DEPARTMENT_OTHER): Payer: Self-pay | Admitting: Obstetrics & Gynecology

## 2022-08-24 LAB — URINE CULTURE

## 2022-08-30 ENCOUNTER — Other Ambulatory Visit (HOSPITAL_BASED_OUTPATIENT_CLINIC_OR_DEPARTMENT_OTHER): Payer: Self-pay | Admitting: Obstetrics & Gynecology

## 2022-08-30 ENCOUNTER — Encounter (HOSPITAL_BASED_OUTPATIENT_CLINIC_OR_DEPARTMENT_OTHER): Payer: Self-pay | Admitting: Obstetrics & Gynecology

## 2022-08-30 DIAGNOSIS — R102 Pelvic and perineal pain: Secondary | ICD-10-CM

## 2022-08-30 MED ORDER — NITROFURANTOIN MONOHYD MACRO 100 MG PO CAPS: 100.0000 mg | ORAL_CAPSULE | Freq: Two times a day (BID) | ORAL | 0 refills | Status: DC

## 2022-08-31 ENCOUNTER — Encounter (HOSPITAL_BASED_OUTPATIENT_CLINIC_OR_DEPARTMENT_OTHER): Payer: Self-pay | Admitting: Obstetrics & Gynecology

## 2022-08-31 ENCOUNTER — Ambulatory Visit (HOSPITAL_BASED_OUTPATIENT_CLINIC_OR_DEPARTMENT_OTHER): Payer: No Typology Code available for payment source | Admitting: Obstetrics & Gynecology

## 2022-08-31 VITALS — BP 158/74 | HR 98 | Ht <= 58 in | Wt 129.0 lb

## 2022-08-31 DIAGNOSIS — Z87440 Personal history of urinary (tract) infections: Secondary | ICD-10-CM

## 2022-08-31 DIAGNOSIS — N390 Urinary tract infection, site not specified: Secondary | ICD-10-CM | POA: Diagnosis not present

## 2022-08-31 LAB — POCT URINALYSIS DIPSTICK
Bilirubin, UA: NEGATIVE
Glucose, UA: NEGATIVE
Ketones, UA: NEGATIVE
Leukocytes, UA: NEGATIVE
Nitrite, UA: NEGATIVE
Protein, UA: NEGATIVE
Spec Grav, UA: 1.02 (ref 1.010–1.025)
Urobilinogen, UA: 0.2 E.U./dL
pH, UA: 7.5 (ref 5.0–8.0)

## 2022-09-02 LAB — URINE CULTURE

## 2022-09-03 DIAGNOSIS — Z87440 Personal history of urinary (tract) infections: Secondary | ICD-10-CM | POA: Insufficient documentation

## 2022-09-03 MED ORDER — CEFUROXIME AXETIL 250 MG PO TABS: 250.0000 mg | ORAL_TABLET | Freq: Two times a day (BID) | ORAL | 0 refills | Status: DC

## 2022-09-03 NOTE — Progress Notes (Signed)
GYNECOLOGY  VISIT  CC:   follow up UTI  HPI: 57 y.o. G72P1001 [name] here for continued urinary symptoms.  Her dysuria, urine pressure and pain is much better but it still doesn't feel totally back to "normal" with voiding.  There is a sensation of urinary urgency.  Denies hematuria.  The last 24 hours, she's had some low back pain but no flank pain.  Just doesn't feel well and wonders if this is related to her UTI or something else.   Past Medical History:  Diagnosis Date   Fibroid     MEDS:   Current Outpatient Medications on File Prior to Visit  Medication Sig Dispense Refill   Cholecalciferol (VITAMIN D PO) Take 2,000 Int'l Units by mouth as needed.      fluconazole (DIFLUCAN) 150 MG tablet Take 1 tablet orally and repeat in 72 hours if needed 2 tablet 0   Multiple Vitamins-Minerals (ONE-A-DAY WOMENS 50+ PO) Take by mouth.     nitrofurantoin, macrocrystal-monohydrate, (MACROBID) 100 MG capsule Take 1 capsule (100 mg total) by mouth 2 (two) times daily. 10 capsule 0   No current facility-administered medications on file prior to visit.    ALLERGIES: Augmentin [amoxicillin-pot clavulanate] and Daypro [oxaprozin]  SH:  married, non smoker  Review of Systems  Constitutional: Negative.   Gastrointestinal:        + urinary urgency    PHYSICAL EXAMINATION:    BP (!) 158/74 (BP Location: Right Arm, Patient Position: Sitting, Cuff Size: Normal)   Pulse 98   Ht 4\' 9"  (1.448 m) Comment: Reported  Wt 129 lb (58.5 kg) Comment: Reported  LMP 10/08/2017   BMI 27.92 kg/m     General appearance: alert, cooperative and appears stated age Back: no CVA tenderness   Assessment/Plan: 1. History of UTI - pt finishing macrobid - will repeat culture.  If positive, will add additional treatment is ns needed - POCT Urinalysis Dipstick - Urine Culture

## 2022-09-05 ENCOUNTER — Other Ambulatory Visit (HOSPITAL_BASED_OUTPATIENT_CLINIC_OR_DEPARTMENT_OTHER): Payer: Self-pay | Admitting: Family Medicine

## 2022-09-05 ENCOUNTER — Ambulatory Visit: Payer: No Typology Code available for payment source

## 2022-09-05 DIAGNOSIS — M545 Low back pain, unspecified: Secondary | ICD-10-CM

## 2022-09-05 DIAGNOSIS — R319 Hematuria, unspecified: Secondary | ICD-10-CM

## 2022-09-05 DIAGNOSIS — N390 Urinary tract infection, site not specified: Secondary | ICD-10-CM

## 2022-09-07 ENCOUNTER — Encounter (HOSPITAL_BASED_OUTPATIENT_CLINIC_OR_DEPARTMENT_OTHER): Payer: Self-pay | Admitting: Obstetrics & Gynecology

## 2022-09-07 ENCOUNTER — Ambulatory Visit (HOSPITAL_BASED_OUTPATIENT_CLINIC_OR_DEPARTMENT_OTHER): Payer: Self-pay

## 2022-09-12 ENCOUNTER — Ambulatory Visit (HOSPITAL_BASED_OUTPATIENT_CLINIC_OR_DEPARTMENT_OTHER): Payer: No Typology Code available for payment source

## 2022-09-14 ENCOUNTER — Other Ambulatory Visit (HOSPITAL_BASED_OUTPATIENT_CLINIC_OR_DEPARTMENT_OTHER): Payer: Self-pay | Admitting: Obstetrics & Gynecology

## 2022-09-14 ENCOUNTER — Encounter (HOSPITAL_BASED_OUTPATIENT_CLINIC_OR_DEPARTMENT_OTHER): Payer: Self-pay | Admitting: Obstetrics & Gynecology

## 2022-09-14 DIAGNOSIS — R102 Pelvic and perineal pain: Secondary | ICD-10-CM

## 2022-09-14 MED ORDER — FLUCONAZOLE 150 MG PO TABS
ORAL_TABLET | ORAL | 0 refills | Status: DC
Start: 2022-09-14 — End: 2023-09-19

## 2022-09-19 ENCOUNTER — Ambulatory Visit (HOSPITAL_BASED_OUTPATIENT_CLINIC_OR_DEPARTMENT_OTHER): Payer: No Typology Code available for payment source

## 2022-09-19 DIAGNOSIS — Z87440 Personal history of urinary (tract) infections: Secondary | ICD-10-CM | POA: Diagnosis not present

## 2022-09-19 LAB — POCT URINALYSIS DIPSTICK
Bilirubin, UA: NEGATIVE
Glucose, UA: NEGATIVE
Ketones, UA: NEGATIVE
Nitrite, UA: NEGATIVE
Protein, UA: POSITIVE — AB
Spec Grav, UA: >=1.03 — AB (ref 1.010–1.025)
Urobilinogen, UA: 0.2 E.U./dL
pH, UA: 5.5 (ref 5.0–8.0)

## 2022-09-19 NOTE — Progress Notes (Signed)
Patient came in today to repeat urine culture. tbw

## 2022-09-21 ENCOUNTER — Other Ambulatory Visit (HOSPITAL_BASED_OUTPATIENT_CLINIC_OR_DEPARTMENT_OTHER): Payer: Self-pay | Admitting: Obstetrics & Gynecology

## 2022-09-21 ENCOUNTER — Encounter (HOSPITAL_BASED_OUTPATIENT_CLINIC_OR_DEPARTMENT_OTHER): Payer: Self-pay | Admitting: Obstetrics & Gynecology

## 2022-09-21 DIAGNOSIS — N898 Other specified noninflammatory disorders of vagina: Secondary | ICD-10-CM

## 2022-09-21 DIAGNOSIS — N390 Urinary tract infection, site not specified: Secondary | ICD-10-CM

## 2022-09-21 LAB — URINE CULTURE

## 2022-09-21 MED ORDER — CEPHALEXIN 500 MG PO CAPS
500.0000 mg | ORAL_CAPSULE | Freq: Two times a day (BID) | ORAL | 0 refills | Status: DC
Start: 2022-09-21 — End: 2022-10-08

## 2022-09-21 MED ORDER — ESTRADIOL 0.1 MG/GM VA CREA
TOPICAL_CREAM | VAGINAL | 1 refills | Status: DC
Start: 2022-09-21 — End: 2022-10-26

## 2022-10-03 ENCOUNTER — Ambulatory Visit
Admission: RE | Admit: 2022-10-03 | Discharge: 2022-10-03 | Disposition: A | Discharge status: Home / Self Care | Payer: No Typology Code available for payment source | Source: Ambulatory Visit | Attending: Obstetrics & Gynecology | Admitting: Obstetrics & Gynecology

## 2022-10-03 DIAGNOSIS — Z13820 Encounter for screening for osteoporosis: Secondary | ICD-10-CM

## 2022-10-05 ENCOUNTER — Other Ambulatory Visit (HOSPITAL_BASED_OUTPATIENT_CLINIC_OR_DEPARTMENT_OTHER): Payer: No Typology Code available for payment source

## 2022-10-05 DIAGNOSIS — N390 Urinary tract infection, site not specified: Secondary | ICD-10-CM

## 2022-10-06 ENCOUNTER — Encounter (HOSPITAL_BASED_OUTPATIENT_CLINIC_OR_DEPARTMENT_OTHER): Payer: Self-pay | Admitting: Obstetrics & Gynecology

## 2022-10-07 LAB — URINE CULTURE

## 2022-10-08 ENCOUNTER — Encounter (HOSPITAL_BASED_OUTPATIENT_CLINIC_OR_DEPARTMENT_OTHER): Payer: Self-pay | Admitting: Obstetrics & Gynecology

## 2022-10-08 MED ORDER — CEFADROXIL 500 MG PO CAPS
500.0000 mg | ORAL_CAPSULE | Freq: Two times a day (BID) | ORAL | 0 refills | Status: DC
Start: 2022-10-08 — End: 2022-10-26

## 2022-10-08 NOTE — Addendum Note (Signed)
Addended by: Jerene Bears on: 10/08/2022 05:26 PM   Modules accepted: Orders

## 2022-10-26 ENCOUNTER — Encounter (HOSPITAL_BASED_OUTPATIENT_CLINIC_OR_DEPARTMENT_OTHER): Payer: Self-pay | Admitting: Obstetrics & Gynecology

## 2022-10-26 ENCOUNTER — Ambulatory Visit (INDEPENDENT_AMBULATORY_CARE_PROVIDER_SITE_OTHER): Payer: No Typology Code available for payment source | Admitting: Obstetrics & Gynecology

## 2022-10-26 VITALS — BP 151/74 | HR 76 | Ht <= 58 in | Wt 132.0 lb

## 2022-10-26 DIAGNOSIS — N390 Urinary tract infection, site not specified: Secondary | ICD-10-CM

## 2022-10-26 DIAGNOSIS — M816 Localized osteoporosis [Lequesne]: Secondary | ICD-10-CM | POA: Diagnosis not present

## 2022-10-26 NOTE — Progress Notes (Signed)
Subjective:    52 yrs Married Caucasian G73P1001  female here to discuss recent BMD obtained 10/03/2022 showing osteoporosis in the femur with T score of -2.9.  This is worse in the left femur.  She did see Dr. Marlou Porch, urology.  She is now on vaginal valium and he did not recommend starting the vaginal estrogen cream.  She did have a CT scan on 09/05/2022 that ruled out stones.  He also recommended physical therapy with pelvic PT provider in their office.  This will start next week.  Urine culture was still positive per pt report.     CMP and TSH and Vit D were normal in April with Dr. Chanetta Marshall.    Osteoporosis Risk Factors  Nonmodifiable Personal Hx of fracture as an adult: no Family hx of osteoporosis:  No but mother has osteopenia   Potentially modifiable: Tobacco use: no Low body weight (<127 lbs): no Estrogen deficiency with either early menopause (age <45) or bilateral ovariectomy: no Low calcium intake (lifelong): no Alcohol use more than 2 drinks per day: no Recurrent falls: no Inadequate physical activity: no Current calcium and Vit D intake:  Taking calcium (dosage unsure) and Vit D 4000 IU every other day.    Past Medical History:  Diagnosis Date   Fibroid    Current Outpatient Medications on File Prior to Visit  Medication Sig Dispense Refill   Cholecalciferol (VITAMIN D PO) Take 2,000 Int'l Units by mouth as needed.      CRANBERRY-CALCIUM PO Take by mouth.     fluconazole (DIFLUCAN) 150 MG tablet Take 1 tablet orally and repeat in 72 hours if needed 2 tablet 0   Lactobacillus (PROBIOTIC ACIDOPHILUS PO) Take by mouth.     Multiple Vitamins-Minerals (ONE-A-DAY WOMENS 50+ PO) Take by mouth.     No current facility-administered medications on file prior to visit.   Allergies  Allergen Reactions   Augmentin [Amoxicillin-Pot Clavulanate] Nausea And Vomiting   Daypro [Oxaprozin] Rash    Review of Systems Pertinent items noted in HPI and remainder of comprehensive ROS  otherwise negative.     Objective:   PHYSICAL EXAM BP (!) 151/74 (BP Location: Right Arm, Patient Position: Sitting, Cuff Size: Normal)   Pulse 76   Ht 4\' 8"  (1.422 m)   Wt 132 lb (59.9 kg)   LMP 10/08/2017   BMI 29.59 kg/m  General appearance: alert and no distress  Imaging Bone Density: Spine T Score: -2.1, Hip T Score: -2.9 done on 10/02/2021.                                          Assessment:   Osteoporosis with T score -2.9 in left hip   Plan:   1. Localized osteoporosis without current pathological fracture - PTH, Intact and Calcium - Normal TSh, Vit D and CMP 08/2022 - options for treatment discussed.  Considering prolia.  Will precert.  Administration and timing, side effects discussed - repeat BMD in 2 years - pt is going to check about a specific PT for referral.  Will send me name so I can place referral.  2. Recurrent UTI - has seen urology

## 2022-10-27 LAB — PTH, INTACT AND CALCIUM
Calcium: 9.7 mg/dL (ref 8.7–10.2)
PTH: 42 pg/mL (ref 15–65)

## 2022-11-07 ENCOUNTER — Encounter (HOSPITAL_BASED_OUTPATIENT_CLINIC_OR_DEPARTMENT_OTHER): Payer: Self-pay | Admitting: Obstetrics & Gynecology

## 2022-11-17 ENCOUNTER — Telehealth (HOSPITAL_BASED_OUTPATIENT_CLINIC_OR_DEPARTMENT_OTHER): Payer: Self-pay | Admitting: *Deleted

## 2022-11-17 NOTE — Telephone Encounter (Signed)
DPR reviewed. LMOVM that insurance denied Prolia request as she has not tried any oral medications. Advised pt to call office to let us know if she is interested in trying an oral med.

## 2022-11-19 ENCOUNTER — Encounter (HOSPITAL_BASED_OUTPATIENT_CLINIC_OR_DEPARTMENT_OTHER): Payer: Self-pay | Admitting: Obstetrics & Gynecology

## 2022-11-20 ENCOUNTER — Other Ambulatory Visit (HOSPITAL_BASED_OUTPATIENT_CLINIC_OR_DEPARTMENT_OTHER): Payer: Self-pay | Admitting: Obstetrics & Gynecology

## 2022-11-20 DIAGNOSIS — M816 Localized osteoporosis [Lequesne]: Secondary | ICD-10-CM

## 2022-11-20 MED ORDER — ALENDRONATE SODIUM 70 MG PO TABS
70.0000 mg | ORAL_TABLET | ORAL | 12 refills | Status: DC
Start: 2022-11-20 — End: 2023-07-27

## 2022-11-29 ENCOUNTER — Ambulatory Visit: Payer: No Typology Code available for payment source | Attending: Obstetrics & Gynecology

## 2022-11-29 ENCOUNTER — Other Ambulatory Visit: Payer: Self-pay

## 2022-11-29 DIAGNOSIS — M6281 Muscle weakness (generalized): Secondary | ICD-10-CM | POA: Diagnosis present

## 2022-11-29 DIAGNOSIS — M5459 Other low back pain: Secondary | ICD-10-CM | POA: Diagnosis present

## 2022-11-29 DIAGNOSIS — M816 Localized osteoporosis [Lequesne]: Secondary | ICD-10-CM | POA: Diagnosis present

## 2022-11-29 NOTE — Therapy (Addendum)
OUTPATIENT PHYSICAL THERAPY EVALUATION PHYSICAL THERAPY DISCHARGE SUMMARY  Visits from Start of Care: 1  Current functional level related to goals / functional outcomes: No re-assessment of goals.   Remaining deficits: Status unknown.    Education / Equipment: N/A   Patient agrees to discharge. Patient goals were not met. Patient is being discharged due to not returning since the last visit.   Patient Name: Anna Stein MRN: [id number] DOB:[birth year],56 y.o., female Today's Date: 11/29/2022   END OF SESSION:  PT End of Session - 11/29/22 1608     Visit Number 1    Number of Visits 9    Date for PT Re-Evaluation 01/27/23    Authorization Type [employer]    PT Start Time 1608    PT Stop Time 1657    PT Time Calculation (min) 49 min    Activity Tolerance Patient tolerated treatment well    Behavior During Therapy [facility] for tasks assessed/performed              Past Medical History:  Diagnosis Date   Fibroid    Past Surgical History:  Procedure Laterality Date   MOHS SURGERY     on face for basal cell   Patient Active Problem List   Diagnosis Date Noted   History of UTI 09/03/2022   Alopecia 12/12/2020   Postmenopausal 12/12/2020   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   Carpal tunnel syndrome on left 05/03/2015   Carpal tunnel syndrome on right 05/03/2015    PCP: Shon Hale, MD  REFERRING PROVIDER: Jerene Bears, MD   REFERRING DIAG: M81.6 (ICD-10-CM) - Localized osteoporosis without current pathological fracture   Rationale for Evaluation and Treatment: Rehabilitation  THERAPY DIAG:  Localized osteoporosis without current pathological fracture  Muscle weakness (generalized)  Other low back pain  ONSET DATE: May 2024  SUBJECTIVE:                                                                                                                                                                                           SUBJECTIVE  STATEMENT: Patient had her first bone density scan and was found to have osteoporosis in her Lt hip. She wants to know what to do to help. She wants to make sure she knows what exercises she can do to strengthen.She is not experiencing any pain currently, but does have occasional low back pain that she attributes to sitting at work that has been ongoing for 6 months. She reports the low back pain is periodic rated as a 2/10. She denies any changes in bowel/bladder. No saddle paraesthesia.   PERTINENT HISTORY:  Osteoporosis  PAIN:  Are you having pain? No  PRECAUTIONS: None  WEIGHT BEARING RESTRICTIONS: No  FALLS:  Has patient fallen in last 6 months? No  LIVING ENVIRONMENT: Lives with: lives with their family Lives in: House/apartment Stairs: Yes: External: 5 steps; can reach both Has following equipment at home: None  OCCUPATION: CMA   PLOF: Independent  PATIENT GOALS: "I want to start bone strengthening and want to know that I am doing the right things."    OBJECTIVE:   DIAGNOSTIC FINDINGS:  N/A  PATIENT SURVEYS:  FOTO 97% function  SCREENING FOR RED FLAGS: Bowel or bladder incontinence: No Cauda equina syndrome: No   COGNITION: Overall cognitive status: Within functional limits for tasks assessed     SENSATION: Not tested    POSTURE: rounded shoulders and forward head  PALPATION: Not assessed   LUMBAR ROM:   AROM eval  Flexion 25% limited   Extension WNL  Right lateral flexion 25% limited   Left lateral flexion 25% limited   Right rotation 25% limited  Left rotation 25% limited    (Blank rows = not tested)  LOWER EXTREMITY ROM:     Active  Right eval Left eval  Hip flexion    Hip extension    Hip abduction    Hip adduction    Hip internal rotation    Hip external rotation    Knee flexion    Knee extension    Ankle dorsiflexion    Ankle plantarflexion    Ankle inversion    Ankle eversion     (Blank rows = not tested)  LOWER  EXTREMITY MMT:    MMT Right eval Left eval  Hip flexion 4 4  Hip extension 4 4  Hip abduction 4 4-  Hip adduction    Hip internal rotation    Hip external rotation    Knee flexion    Knee extension    Ankle dorsiflexion    Ankle plantarflexion    Ankle inversion    Ankle eversion     (Blank rows = not tested)  SPECIAL TESTS:  (+) Adam's forward bend   FUNCTIONAL TESTS:  Functional lifting: excessive anterior tibial translation, poor hip hinge    GAIT: Distance walked: 10 ft Assistive device utilized: None Level of assistance: Complete Independence Comments: WNL  [facility] Adult PT Treatment:                                                DATE: 11/29/22  Therapeutic Exercise: Demonstrated and performed initial HEP (see below).    Therapeutic Activity: Education on assessment findings that will be addressed throughout duration of POC.   Self Care: Education on proper bending/lifting mechanics.     PATIENT EDUCATION:  Education details: see treatment Person educated: Patient Education method: Explanation, Demonstration, Tactile cues, Verbal cues, and Handouts Education comprehension: verbalized understanding, returned demonstration, verbal cues required, tactile cues required, and needs further education  HOME EXERCISE PROGRAM: Access Code: [email or web address] URL: [email or web address] Date: 11/29/2022 Prepared by: Letitia Libra  Exercises - Supine Bridge  - 1 x daily - 3 x weekly - 2 sets - 10 reps - Squat with Chair Touch  - 1 x daily - 3 x weekly - 2 sets - 10 reps - Squat  - 1 x daily - 3 x weekly - 2 sets - 10 reps - Lateral  Lunge  - 1 x daily - 3 x weekly - 2 sets - 10 reps - Reverse Lunge  - 1 x daily - 3 x weekly - 2 sets - 10 reps - Shoulder Overhead Press in Flexion with Dumbbells  - 1 x daily - 3 x weekly - 2 sets - 10 reps - Standing Shoulder Flexion to 90 Degrees with Dumbbells  - 1 x daily - 3 x weekly - 2 sets - 10 reps - Seated Hip Hinge  with Dowel  - 1 x daily - 3 x weekly - 2 sets - 10 reps - Deadlift with Resistance  - 1 x daily - 3 x weekly - 2 sets - 10 reps  ASSESSMENT:  CLINICAL IMPRESSION: Patient is a 57 y.o. female who was seen today for physical therapy evaluation and treatment for recent diagnosis of osteoporosis. Upon assessment she is noted to have bilateral hip weakness, aberrant lifting mechanics, and limited trunk ROM. She will benefit from skilled PT to address the above stated deficits and train in advanced home program that she can continue independently in order to manage her osteoporosis and reduce risk of future injury.    OBJECTIVE IMPAIRMENTS: decreased knowledge of condition, decreased ROM, decreased strength, impaired flexibility, improper body mechanics, postural dysfunction, and pain.   ACTIVITY LIMITATIONS: lifting and bending  PARTICIPATION LIMITATIONS: community activity and recreational activity  PERSONAL FACTORS: Age and Fitness are also affecting patient's functional outcome.   REHAB POTENTIAL: Good  CLINICAL DECISION MAKING: Stable/uncomplicated  EVALUATION COMPLEXITY: Low   GOALS: Goals reviewed with patient? Yes  SHORT TERM GOALS: Target date: 12/27/2022    Patient will be independent and compliant with initial HEP.   Baseline: see above Goal status: INITIAL  2.  Patient will demonstrate proper hip hinge to reduce stress on her back with lifting.  Baseline: see above  Goal status: INITIAL   LONG TERM GOALS: Target date: 01/27/23  Patient will demonstrate 5/5 bilateral hip strength to improve lumbopelvic stability. Baseline: see above Goal status: INITIAL  2.  Patient will be able to lift at least 15 lbs with proper form.  Baseline: see above Goal status: INITIAL  3.  Patient will be independent with advanced home program to assist in management of her chronic condition.  Baseline: see above Goal status: INITIAL     PLAN:  PT FREQUENCY: 1x/week  PT  DURATION: 8 weeks  PLANNED INTERVENTIONS: Therapeutic exercises, Therapeutic activity, Neuromuscular re-education, Balance training, Gait training, Patient/Family education, Self Care, Dry Needling, Cryotherapy, Moist heat, Manual therapy, and Re-evaluation.  PLAN FOR NEXT SESSION: n/a  Letitia Libra, PT, DPT, ATC 11/29/22 5:12 PM  Letitia Libra, PT, DPT, ATC 01/12/23 8:46 AM

## 2023-01-19 ENCOUNTER — Encounter (HOSPITAL_BASED_OUTPATIENT_CLINIC_OR_DEPARTMENT_OTHER): Payer: Self-pay | Admitting: Obstetrics & Gynecology

## 2023-02-23 ENCOUNTER — Encounter (HOSPITAL_BASED_OUTPATIENT_CLINIC_OR_DEPARTMENT_OTHER): Payer: Self-pay | Admitting: Obstetrics & Gynecology

## 2023-04-23 ENCOUNTER — Ambulatory Visit (HOSPITAL_BASED_OUTPATIENT_CLINIC_OR_DEPARTMENT_OTHER): Payer: No Typology Code available for payment source | Admitting: Obstetrics & Gynecology

## 2023-06-18 ENCOUNTER — Ambulatory Visit (HOSPITAL_BASED_OUTPATIENT_CLINIC_OR_DEPARTMENT_OTHER): Payer: No Typology Code available for payment source | Admitting: Obstetrics & Gynecology

## 2023-07-27 ENCOUNTER — Encounter: Payer: Self-pay | Admitting: Plastic Surgery

## 2023-07-27 ENCOUNTER — Ambulatory Visit: Payer: No Typology Code available for payment source | Admitting: Plastic Surgery

## 2023-07-27 VITALS — BP 150/89 | HR 73 | Ht <= 58 in | Wt 135.6 lb

## 2023-07-27 DIAGNOSIS — D2371 Other benign neoplasm of skin of right lower limb, including hip: Secondary | ICD-10-CM | POA: Diagnosis not present

## 2023-07-27 NOTE — Progress Notes (Signed)
     Patient ID: Anna Stein, female    DOB: 14-May-1966, 58 y.o.   MRN: [id number]   Chief Complaint  Patient presents with   Advice Only    The patient is a 58 year old female here for evaluation of her right leg.  The patient states she has had a lesion on the lower shin area of the right leg for the past couple of years.  It has been evaluated by dermatology.  It has been getting bigger and a little bit tender.  The patient is otherwise in good health.  She sees a dermatologist regularly.  The lesion is about 1 and half centimeters in size is firm and seems to have the consistency of a dermatofibroma.    Review of Systems  Constitutional: Negative.   HENT: Negative.    Eyes: Negative.   Respiratory: Negative.    Cardiovascular: Negative.   Gastrointestinal: Negative.   Endocrine: Negative.   Genitourinary: Negative.   Musculoskeletal: Negative.     Past Medical History:  Diagnosis Date   Fibroid     Past Surgical History:  Procedure Laterality Date   MOHS SURGERY     on face for basal cell      Current Outpatient Medications:    Cholecalciferol (VITAMIN D PO), Take 2,000 Int'l Units by mouth as needed. , Disp: , Rfl:    CRANBERRY-CALCIUM PO, Take by mouth., Disp: , Rfl:    fluconazole (DIFLUCAN) 150 MG tablet, Take 1 tablet orally and repeat in 72 hours if needed, Disp: 2 tablet, Rfl: 0   Multiple Vitamins-Minerals (ONE-A-DAY WOMENS 50+ PO), Take by mouth., Disp: , Rfl:    Objective:   Vitals:   07/27/23 0931  BP: (!) 150/89  Pulse: 73  SpO2: 98%    Physical Exam HENT:     Head: Atraumatic.  Cardiovascular:     Rate and Rhythm: Normal rate.  Musculoskeletal:        General: No swelling, tenderness or signs of injury.       Legs:  Skin:    General: Skin is dry.     Capillary Refill: Capillary refill takes less than 2 seconds.     Coloration: Skin is not jaundiced.     Findings: Lesion present. No bruising.  Neurological:     Mental Status:  She is oriented to person, place, and time.  Psychiatric:        Mood and Affect: Mood normal.        Behavior: Behavior normal.        Thought Content: Thought content normal.        Judgment: Judgment normal.     Assessment & Plan:  Dermatofibroma of right lower leg  Plan for excision of right lower leg skin lesion, likely dermatofibroma.  Risks and complications were described.  I would like her to get compression socks because she is going to need those afterwards.  In the meantime increase protein and decrease carbs and sugars between now and surgery.  Will plan to do this in the office.  Pictures were obtained of the patient and placed in the chart with the patient's or guardian's permission.   Alena Bills Addysyn Fern, DO

## 2023-07-31 ENCOUNTER — Ambulatory Visit (HOSPITAL_BASED_OUTPATIENT_CLINIC_OR_DEPARTMENT_OTHER): Payer: No Typology Code available for payment source | Admitting: Obstetrics & Gynecology

## 2023-08-28 DIAGNOSIS — K559 Vascular disorder of intestine, unspecified: Secondary | ICD-10-CM

## 2023-08-28 HISTORY — DX: Vascular disorder of intestine, unspecified: K55.9

## 2023-09-11 ENCOUNTER — Other Ambulatory Visit: Payer: Self-pay | Admitting: Gastroenterology

## 2023-09-11 ENCOUNTER — Ambulatory Visit
Admission: RE | Admit: 2023-09-11 | Discharge: 2023-09-11 | Disposition: A | Discharge status: Home / Self Care | Source: Ambulatory Visit | Attending: Gastroenterology

## 2023-09-11 DIAGNOSIS — K625 Hemorrhage of anus and rectum: Secondary | ICD-10-CM

## 2023-09-11 DIAGNOSIS — R109 Unspecified abdominal pain: Secondary | ICD-10-CM

## 2023-09-11 DIAGNOSIS — R197 Diarrhea, unspecified: Secondary | ICD-10-CM

## 2023-09-11 MED ORDER — IOPAMIDOL (ISOVUE-370) INJECTION 76%
500.0000 mL | Freq: Once | INTRAVENOUS | Status: AC | PRN
  Administered 2023-09-11: 80 mL via INTRAVENOUS

## 2023-09-12 ENCOUNTER — Other Ambulatory Visit: Payer: Self-pay | Admitting: Gastroenterology

## 2023-09-12 DIAGNOSIS — R197 Diarrhea, unspecified: Secondary | ICD-10-CM

## 2023-09-12 DIAGNOSIS — K625 Hemorrhage of anus and rectum: Secondary | ICD-10-CM

## 2023-09-12 DIAGNOSIS — R109 Unspecified abdominal pain: Secondary | ICD-10-CM

## 2023-09-18 ENCOUNTER — Other Ambulatory Visit: Payer: Self-pay | Admitting: Obstetrics & Gynecology

## 2023-09-18 DIAGNOSIS — Z1231 Encounter for screening mammogram for malignant neoplasm of breast: Secondary | ICD-10-CM

## 2023-09-19 ENCOUNTER — Encounter (HOSPITAL_BASED_OUTPATIENT_CLINIC_OR_DEPARTMENT_OTHER): Payer: Self-pay | Admitting: Obstetrics & Gynecology

## 2023-09-19 ENCOUNTER — Ambulatory Visit (HOSPITAL_BASED_OUTPATIENT_CLINIC_OR_DEPARTMENT_OTHER): Payer: No Typology Code available for payment source | Admitting: Obstetrics & Gynecology

## 2023-09-19 ENCOUNTER — Other Ambulatory Visit (HOSPITAL_COMMUNITY)
Admission: RE | Admit: 2023-09-19 | Discharge: 2023-09-19 | Disposition: A | Discharge status: Home / Self Care | Source: Ambulatory Visit | Attending: Obstetrics & Gynecology | Admitting: Obstetrics & Gynecology

## 2023-09-19 VITALS — BP 134/88 | HR 70 | Ht <= 58 in | Wt 131.4 lb

## 2023-09-19 DIAGNOSIS — Z124 Encounter for screening for malignant neoplasm of cervix: Secondary | ICD-10-CM | POA: Insufficient documentation

## 2023-09-19 DIAGNOSIS — M816 Localized osteoporosis [Lequesne]: Secondary | ICD-10-CM

## 2023-09-19 DIAGNOSIS — Z780 Asymptomatic menopausal state: Secondary | ICD-10-CM | POA: Diagnosis not present

## 2023-09-19 DIAGNOSIS — Z01419 Encounter for gynecological examination (general) (routine) without abnormal findings: Secondary | ICD-10-CM

## 2023-09-19 NOTE — Addendum Note (Signed)
 Addended by: [name] on: 09/19/2023 11:47 AM   Modules accepted: Orders

## 2023-09-19 NOTE — Progress Notes (Signed)
 ANNUAL EXAM Patient name: Anna Stein MRN [id number]  Date of birth: 07/15/1965 Chief Complaint:   Annual Exam  History of Present Illness:   Anna Stein is a 58 y.o. G68P1001 Caucasian female being seen today for a routine annual exam.  Had probable ischemic colitis the week before Easter.  She saw Dr. Feliberto Hopping for this.  Improved now but will plan follow up colonoscopy this year.  Had CT for diagnosis.    Denies vaginal bleeding.  She does desires having a pap again today.  HR HPV was negative 03/2022.    We discussed BMD again today.  Insurance does cover prolia but this is costly.  She tried fosamax  and had possible side effects.  She is taking Vit D and calcium and doing exercises.  Vit D yesterday with lab work was 48.  Will plan to repeat next year and then make decision.    Patient's last menstrual period was 10/08/2017.  Last pap 04/10/2022. Results were: NILM w/ HRHPV negative. H/O abnormal pap: no Last mammogram: 08/08/2022. Results were: normal. Family h/o breast cancer: no Last colonoscopy: 01/2017. Results were: 2mm polyp, f/u 7 years. Family h/o colorectal cancer: no     09/19/2023    8:34 AM 10/26/2022    8:26 AM 08/31/2022    2:19 PM 04/10/2022    4:25 PM 12/14/2020    3:46 PM  Depression screen PHQ 2/9  Decreased Interest 0 0 0 0 0  Down, Depressed, Hopeless 0 0 0 0 0  PHQ - 2 Score 0 0 0 0 0    Review of Systems:   Pertinent items are noted in HPI Denies any headaches, blurred vision, fatigue, shortness of breath, chest pain, abdominal pain, abnormal vaginal discharge/itching/odor/irritation, problems with periods, bowel movements, urination, or intercourse unless otherwise stated above. Pertinent History Reviewed:  Reviewed past medical,surgical, social and family history.  Reviewed problem list, medications and allergies. Physical Assessment:   Vitals:   09/19/23 0831  BP: 134/88  Pulse: 70  Weight: 131 lb 6.4 oz (59.6 kg)  Height: 4'  8.18" (1.427 m)  Body mass index is 29.27 kg/m.        Physical Examination:   General appearance - well appearing, and in no distress  Mental status - alert, oriented to person, place, and time  Psych:  She has a normal mood and affect  Skin - warm and dry, normal color, no suspicious lesions noted  Chest - effort normal, all lung fields clear to auscultation bilaterally  Heart - normal rate and regular rhythm  Neck:  midline trachea, no thyromegaly or nodules  Breasts - breasts appear normal, no suspicious masses, no skin or nipple changes or  axillary nodes  Abdomen - soft, nontender, nondistended, no masses or organomegaly  Pelvic - VULVA: normal appearing vulva with no masses, tenderness or lesions  VAGINA: normal appearing vagina with normal color and discharge, no lesions  CERVIX: normal appearing cervix without discharge or lesions, no CMT  Thin prep pap is done without HR HPV cotesting  UTERUS: uterus is felt to be normal size, shape, consistency and nontender   ADNEXA: No adnexal masses or tenderness noted.  Rectal - deferred  Extremities:  No swelling or varicosities noted  Chaperone present for exam  Assessment & Plan:  1. Well woman exam with routine gynecological exam (Primary) - Pap smear obtained today per pt request - Mammogram scheduled for 10/01/2023 - Colonoscopy will have this updated in the last year -  Bone mineral density planned for next year - lab work done with PCP, Dr. Donia Furlough - vaccines reviewed/updated  2. Postmenopausal - not on HRT  3. Localized osteoporosis without current pathological fracture - planning BMD next year -  on calcium and Vit D   Follow-up: Return in about 1 year (around 09/18/2024).  [name], MD 09/19/2023 9:23 AM

## 2023-09-20 LAB — CYTOLOGY - PAP: Diagnosis: NEGATIVE

## 2023-09-21 ENCOUNTER — Encounter (HOSPITAL_BASED_OUTPATIENT_CLINIC_OR_DEPARTMENT_OTHER): Payer: Self-pay | Admitting: Obstetrics & Gynecology

## 2023-09-25 ENCOUNTER — Ambulatory Visit

## 2023-10-01 ENCOUNTER — Ambulatory Visit
Admission: RE | Admit: 2023-10-01 | Discharge: 2023-10-01 | Disposition: A | Discharge status: Home / Self Care | Source: Ambulatory Visit | Attending: Obstetrics & Gynecology | Admitting: Obstetrics & Gynecology

## 2023-10-01 DIAGNOSIS — Z1231 Encounter for screening mammogram for malignant neoplasm of breast: Secondary | ICD-10-CM

## 2023-10-04 ENCOUNTER — Encounter (HOSPITAL_BASED_OUTPATIENT_CLINIC_OR_DEPARTMENT_OTHER): Payer: Self-pay | Admitting: Obstetrics & Gynecology

## 2023-10-04 DIAGNOSIS — Z13820 Encounter for screening for osteoporosis: Secondary | ICD-10-CM

## 2023-10-04 DIAGNOSIS — Z780 Asymptomatic menopausal state: Secondary | ICD-10-CM

## 2023-10-19 ENCOUNTER — Ambulatory Visit: Payer: No Typology Code available for payment source | Admitting: Plastic Surgery

## 2023-10-28 ENCOUNTER — Encounter (HOSPITAL_BASED_OUTPATIENT_CLINIC_OR_DEPARTMENT_OTHER): Payer: Self-pay | Admitting: Obstetrics & Gynecology

## 2023-10-29 ENCOUNTER — Other Ambulatory Visit (HOSPITAL_BASED_OUTPATIENT_CLINIC_OR_DEPARTMENT_OTHER): Payer: Self-pay | Admitting: Obstetrics & Gynecology

## 2023-10-29 DIAGNOSIS — M810 Age-related osteoporosis without current pathological fracture: Secondary | ICD-10-CM

## 2023-10-30 NOTE — Progress Notes (Signed)
 Pt requests that bone density order expiration date be changed. Pt has appt 10/06/24. Order changed.

## 2023-10-30 NOTE — Addendum Note (Signed)
 Addended by: Marliss Simple on: 10/30/2023 02:34 PM   Modules accepted: Orders

## 2024-02-15 ENCOUNTER — Other Ambulatory Visit (HOSPITAL_COMMUNITY)
Admission: RE | Admit: 2024-02-15 | Discharge: 2024-02-15 | Disposition: A | Discharge status: Home / Self Care | Source: Ambulatory Visit | Attending: Plastic Surgery | Admitting: Plastic Surgery

## 2024-02-15 ENCOUNTER — Ambulatory Visit: Payer: No Typology Code available for payment source | Admitting: Plastic Surgery

## 2024-02-15 ENCOUNTER — Encounter: Payer: Self-pay | Admitting: Plastic Surgery

## 2024-02-15 VITALS — BP 149/83 | HR 70 | Ht <= 58 in | Wt 138.4 lb

## 2024-02-15 DIAGNOSIS — D2371 Other benign neoplasm of skin of right lower limb, including hip: Secondary | ICD-10-CM

## 2024-02-15 NOTE — Progress Notes (Signed)
 Procedure Note  Preoperative Dx: right leg mass  Postoperative Dx: Same  Procedure: exision of right leg mass 1.5 x 2 cm  Anesthesia: Lidocaine 1% with 1:100,000 epinephrine  Indication for Procedure: mass  Description of Procedure: Risks and complications were explained to the patient.  Consent was confirmed and the patient understands the risks and benefits.  The potential complications and alternatives were explained and the patient consents.  The patient expressed understanding the option of not having the procedure and the risks of a scar.  Time out was called and all information was confirmed to be correct.    The area was prepped and drapped.  Lidocaine 1% with epinephrine was injected in the subcutaneous area.  After waiting several minutes for the local to take affect a #15 blade was used to excise the area in an eliptical pattern.  The skin edges were reapproximated with 4-0 Monocryl subcuticular running closure.  A dressing was applied.  The patient was given instructions on how to care for the area and a follow up appointment.  [name] tolerated the procedure well and there were no complications. The specimen was sent to pathology.

## 2024-02-20 LAB — SURGICAL PATHOLOGY

## 2024-02-22 NOTE — Addendum Note (Signed)
 Addended by: LOWERY ESTEFANA RAMAN on: 02/22/2024 07:25 AM   Modules accepted: Orders

## 2024-02-29 ENCOUNTER — Encounter: Payer: Self-pay | Admitting: Surgical

## 2024-02-29 ENCOUNTER — Ambulatory Visit (INDEPENDENT_AMBULATORY_CARE_PROVIDER_SITE_OTHER): Payer: Self-pay | Admitting: Surgical

## 2024-02-29 VITALS — BP 142/83 | HR 94 | Wt 138.0 lb

## 2024-02-29 DIAGNOSIS — D2371 Other benign neoplasm of skin of right lower limb, including hip: Secondary | ICD-10-CM

## 2024-02-29 NOTE — Progress Notes (Signed)
 58 year old female here for follow-up after excision of right leg mass with Dr. Lowery on 02/15/2024.  She had 2 pathology specimen sent, the lesion was a dermatofibroma.  A deep margin was sent, the deep margin was still involved on the deep margin specimen.  Patient reports she was aware of the pathology.  She has questions about next steps.  On exam right lower extremity incision is intact and appears to be healing well.  Monocryl suture is noted.  Appears to have some interrupted sutures as well as some mattresses.  There is no surrounding erythema.  There is some slight tenderness with palpation.  No incisional dehiscence is noted.  A/P:  Discussed with patient recommendation for reexcision to obtain negative deep margins.  We discussed pathology.  We discussed reason for not recommending reexcision.  Patient was understanding and agreeable with proceeding with reexcision.  She does have a vacation scheduled in 6 weeks and would like to do it after that.  All of the suture knots were removed with the exception of a mattress noted along the midline portion of the incision.  Discussed with patient that this would dissolve.  There was still a lot of tension on the incision and therefore we elected to leave that suture in place.  Pictures were obtained of the patient and placed in the chart with the patient's or guardian's permission.

## 2024-03-02 ENCOUNTER — Encounter: Payer: Self-pay | Admitting: Plastic Surgery

## 2024-03-03 NOTE — Telephone Encounter (Signed)
 I attempted to call the patient to speak with her about her MyChart message. There was no answer, so I left a message for her to call the office.

## 2024-03-03 NOTE — Telephone Encounter (Signed)
 Reviewed procedure note, sutures used were dissolvable. Please inform patient

## 2024-04-23 LAB — HM COLONOSCOPY

## 2024-06-13 ENCOUNTER — Ambulatory Visit: Admitting: Plastic Surgery

## 2024-06-13 ENCOUNTER — Other Ambulatory Visit (HOSPITAL_COMMUNITY)
Admission: RE | Admit: 2024-06-13 | Discharge: 2024-06-13 | Disposition: A | Source: Ambulatory Visit | Attending: Plastic Surgery | Admitting: Plastic Surgery

## 2024-06-13 VITALS — BP 144/88 | HR 89

## 2024-06-13 DIAGNOSIS — D2371 Other benign neoplasm of skin of right lower limb, including hip: Secondary | ICD-10-CM | POA: Insufficient documentation

## 2024-06-13 DIAGNOSIS — L905 Scar conditions and fibrosis of skin: Secondary | ICD-10-CM

## 2024-06-13 NOTE — Progress Notes (Addendum)
 Procedure Note  Preoperative Dx: Dermatofibroma Postoperative Dx: Same  Procedure: Excision of dermatofibroma positive margin 1 cm right leg  Anesthesia: Lidocaine 1% with 1:100,000 epinephrine  Indication for Procedure: Positive margin  Description of Procedure: Risks and complications were explained to the patient.  Consent was confirmed and the patient understands the risks and benefits.  The potential complications and alternatives were explained and the patient consents.  The patient expressed understanding the option of not having the procedure and the risks of a scar.  Time out was called and all information was confirmed to be correct.    The area was prepped and drapped.  Lidocaine 1% with epinephrine was injected in the subcutaneous area.  After waiting several minutes for the local to take affect a #15 blade was used to incise the skin in the location of the previous incision.  The knife was used to free the soft tissue scarred area directly under the skin.  This was then cut out and sent to pathology this would have been the deep margin of the previous excision.  The skin edges were reapproximated with 4-0 Monocryl.  A dressing was applied.  The patient was given instructions on how to care for the area and a follow up appointment.  Colin tolerated the procedure well and there were no complications. The specimen was sent to pathology.

## 2024-06-17 LAB — SURGICAL PATHOLOGY

## 2024-06-18 ENCOUNTER — Encounter: Payer: Self-pay | Admitting: Plastic Surgery

## 2024-06-26 ENCOUNTER — Encounter: Payer: Self-pay | Admitting: Plastic Surgery

## 2024-06-26 NOTE — Progress Notes (Signed)
 Patient is a 59 year old female who recently underwent excision of dermatofibroma with positive margin to the right leg with Dr. Lowery on 06/13/2024.  Patient is 2 weeks out from her procedure.  She presents to the clinic today for postprocedural follow-up.  Per chart review, pathology shows scar consistent with previous procedure.  Residual dermatofibroma was not identified.  Today, patient reports she is overall doing well.  She denies any issues with the surgical site.  She denies any fevers or chills.  She did have some questions about the pathology report.  On exam, patient sitting upright in no acute distress.  Incision to the right leg appears to be intact with Monocryl sutures.  There is no surrounding erythema, no fluid collection noted.  There are no signs of infection.  I discussed with the patient that the pathology showed that there was no dermatofibroma noted.  I discussed with her though that I will discuss the pathology with Dr. Lowery to see if any next steps are required.  We did discuss the possibility of reexcision.  Patient expressed understanding.  We discussed the possibility of removing the sutures today versus leaving them in.  We did discuss with removal, the risk of dehiscence given the area.  We also did discuss if we left them, she may have train track scarring.  Medical decision making was made with the patient and sutures were left in.  Suture ends though were trimmed.  And patient tolerated well.  Recommended that patient apply Vaseline to her incision daily.  Discussed with her that she may start scar creams in a few weeks.  Discussed with her that direct sunlight will worsen the scar.  She expressed understanding.  Patient to follow back up as needed.  I discussed with her that if the sutures are still in place in about 2 weeks, she may come back to have them removed.  She expressed understanding.  Will discuss pathology with Dr. Lowery and let the  patient know about next steps.

## 2024-06-27 ENCOUNTER — Ambulatory Visit: Admitting: Student

## 2024-06-27 VITALS — BP 135/83 | HR 85

## 2024-06-27 DIAGNOSIS — D2371 Other benign neoplasm of skin of right lower limb, including hip: Secondary | ICD-10-CM

## 2024-09-22 ENCOUNTER — Ambulatory Visit (HOSPITAL_BASED_OUTPATIENT_CLINIC_OR_DEPARTMENT_OTHER): Admitting: Obstetrics & Gynecology

## 2024-10-03 ENCOUNTER — Other Ambulatory Visit

## 2024-10-06 ENCOUNTER — Ambulatory Visit (HOSPITAL_BASED_OUTPATIENT_CLINIC_OR_DEPARTMENT_OTHER)
